# Patient Record
Sex: Female | Born: 1935 | Race: White | Hispanic: No | Marital: Married | Smoking: Never smoker
Health system: Southern US, Community
[De-identification: ages and names within clinical notes are randomized; demographics above are authoritative.]

## PROBLEM LIST (undated history)

## (undated) DIAGNOSIS — R55 Syncope and collapse: Secondary | ICD-10-CM

## (undated) DIAGNOSIS — C801 Malignant (primary) neoplasm, unspecified: Secondary | ICD-10-CM

## (undated) DIAGNOSIS — M1990 Unspecified osteoarthritis, unspecified site: Secondary | ICD-10-CM

## (undated) DIAGNOSIS — Z86718 Personal history of other venous thrombosis and embolism: Secondary | ICD-10-CM

## (undated) DIAGNOSIS — I10 Essential (primary) hypertension: Secondary | ICD-10-CM

## (undated) HISTORY — PX: APPENDECTOMY: SHX54

## (undated) HISTORY — PX: BREAST BIOPSY: SHX20

## (undated) HISTORY — PX: TONSILLECTOMY: SUR1361

## (undated) HISTORY — PX: COLONOSCOPY: SHX174

---

## 2004-05-07 ENCOUNTER — Ambulatory Visit: Payer: Self-pay | Admitting: Gastroenterology

## 2004-05-21 ENCOUNTER — Ambulatory Visit: Payer: Self-pay | Admitting: Internal Medicine

## 2005-04-21 ENCOUNTER — Ambulatory Visit: Payer: Self-pay | Admitting: Internal Medicine

## 2005-05-24 ENCOUNTER — Ambulatory Visit: Payer: Self-pay | Admitting: Internal Medicine

## 2006-05-30 ENCOUNTER — Ambulatory Visit: Payer: Self-pay | Admitting: Internal Medicine

## 2007-02-13 ENCOUNTER — Ambulatory Visit: Payer: Self-pay | Admitting: Internal Medicine

## 2007-03-07 ENCOUNTER — Ambulatory Visit: Payer: Self-pay | Admitting: Internal Medicine

## 2007-05-10 ENCOUNTER — Ambulatory Visit: Payer: Self-pay | Admitting: Gastroenterology

## 2007-06-01 ENCOUNTER — Ambulatory Visit: Payer: Self-pay | Admitting: Internal Medicine

## 2008-06-04 ENCOUNTER — Ambulatory Visit: Payer: Self-pay | Admitting: Internal Medicine

## 2008-09-03 ENCOUNTER — Ambulatory Visit: Payer: Self-pay | Admitting: Internal Medicine

## 2009-06-11 ENCOUNTER — Ambulatory Visit: Payer: Self-pay | Admitting: Internal Medicine

## 2010-06-12 ENCOUNTER — Ambulatory Visit: Payer: Self-pay | Admitting: Internal Medicine

## 2011-06-14 ENCOUNTER — Ambulatory Visit: Payer: Self-pay | Admitting: Internal Medicine

## 2011-09-14 ENCOUNTER — Ambulatory Visit: Payer: Self-pay | Attending: Physical Therapy | Admitting: Physical Therapy

## 2012-06-14 ENCOUNTER — Ambulatory Visit: Payer: Self-pay | Admitting: Internal Medicine

## 2012-10-25 ENCOUNTER — Ambulatory Visit: Payer: Self-pay | Admitting: Gastroenterology

## 2013-06-02 ENCOUNTER — Emergency Department: Payer: Self-pay

## 2013-06-02 LAB — URINALYSIS, COMPLETE
BACTERIA: NONE SEEN
BILIRUBIN, UR: NEGATIVE
Blood: NEGATIVE
Glucose,UR: NEGATIVE mg/dL (ref 0–75)
KETONE: NEGATIVE
Leukocyte Esterase: NEGATIVE
NITRITE: NEGATIVE
Ph: 7 (ref 4.5–8.0)
Protein: NEGATIVE
RBC,UR: NONE SEEN /HPF (ref 0–5)
Specific Gravity: 1.013 (ref 1.003–1.030)
WBC UR: 1 /HPF (ref 0–5)

## 2013-06-02 LAB — BASIC METABOLIC PANEL
Anion Gap: 7 (ref 7–16)
BUN: 12 mg/dL (ref 7–18)
Calcium, Total: 9.5 mg/dL (ref 8.5–10.1)
Chloride: 102 mmol/L (ref 98–107)
Co2: 29 mmol/L (ref 21–32)
Creatinine: 1.01 mg/dL (ref 0.60–1.30)
EGFR (African American): >60
GFR CALC NON AF AMER: 54 — AB
Glucose: 101 mg/dL — ABNORMAL HIGH (ref 65–99)
Osmolality: 276 (ref 275–301)
Potassium: 4.1 mmol/L (ref 3.5–5.1)
SODIUM: 138 mmol/L (ref 136–145)

## 2013-06-02 LAB — CBC
HCT: 40.2 % (ref 35.0–47.0)
HGB: 13.5 g/dL (ref 12.0–16.0)
MCH: 32.4 pg (ref 26.0–34.0)
MCHC: 33.7 g/dL (ref 32.0–36.0)
MCV: 96 fL (ref 80–100)
PLATELETS: 234 10*3/uL (ref 150–440)
RBC: 4.18 10*6/uL (ref 3.80–5.20)
RDW: 13.5 % (ref 11.5–14.5)
WBC: 12.4 10*3/uL — AB (ref 3.6–11.0)

## 2013-06-15 ENCOUNTER — Ambulatory Visit: Payer: Self-pay | Admitting: Internal Medicine

## 2014-04-19 DIAGNOSIS — Z86718 Personal history of other venous thrombosis and embolism: Secondary | ICD-10-CM

## 2014-04-19 HISTORY — DX: Personal history of other venous thrombosis and embolism: Z86.718

## 2014-06-17 ENCOUNTER — Ambulatory Visit: Payer: Self-pay | Admitting: Internal Medicine

## 2014-11-29 ENCOUNTER — Emergency Department: Payer: Medicare PPO

## 2014-11-29 ENCOUNTER — Inpatient Hospital Stay
Admission: EM | Admit: 2014-11-29 | Discharge: 2014-12-02 | DRG: 176 | Disposition: A | Discharge status: Home / Self Care | Payer: Medicare PPO | Attending: Internal Medicine | Admitting: Internal Medicine

## 2014-11-29 ENCOUNTER — Encounter: Payer: Self-pay | Admitting: Emergency Medicine

## 2014-11-29 ENCOUNTER — Inpatient Hospital Stay
Admit: 2014-11-29 | Discharge: 2014-11-29 | Disposition: A | Discharge status: Home / Self Care | Payer: Medicare PPO | Attending: Internal Medicine | Admitting: Internal Medicine

## 2014-11-29 DIAGNOSIS — Z888 Allergy status to other drugs, medicaments and biological substances status: Secondary | ICD-10-CM | POA: Diagnosis not present

## 2014-11-29 DIAGNOSIS — M1990 Unspecified osteoarthritis, unspecified site: Secondary | ICD-10-CM | POA: Diagnosis present

## 2014-11-29 DIAGNOSIS — Z9049 Acquired absence of other specified parts of digestive tract: Secondary | ICD-10-CM | POA: Diagnosis present

## 2014-11-29 DIAGNOSIS — Z803 Family history of malignant neoplasm of breast: Secondary | ICD-10-CM

## 2014-11-29 DIAGNOSIS — I2699 Other pulmonary embolism without acute cor pulmonale: Secondary | ICD-10-CM | POA: Diagnosis not present

## 2014-11-29 DIAGNOSIS — Z886 Allergy status to analgesic agent status: Secondary | ICD-10-CM

## 2014-11-29 DIAGNOSIS — R0902 Hypoxemia: Secondary | ICD-10-CM | POA: Diagnosis present

## 2014-11-29 DIAGNOSIS — M810 Age-related osteoporosis without current pathological fracture: Secondary | ICD-10-CM | POA: Diagnosis present

## 2014-11-29 DIAGNOSIS — I10 Essential (primary) hypertension: Secondary | ICD-10-CM | POA: Diagnosis present

## 2014-11-29 DIAGNOSIS — Z79899 Other long term (current) drug therapy: Secondary | ICD-10-CM | POA: Diagnosis not present

## 2014-11-29 HISTORY — DX: Unspecified osteoarthritis, unspecified site: M19.90

## 2014-11-29 LAB — URINALYSIS COMPLETE WITH MICROSCOPIC (ARMC ONLY)
BILIRUBIN URINE: NEGATIVE
Bacteria, UA: NONE SEEN
Glucose, UA: NEGATIVE mg/dL
HGB URINE DIPSTICK: NEGATIVE
Nitrite: NEGATIVE
Protein, ur: NEGATIVE mg/dL
Specific Gravity, Urine: 1.045 — ABNORMAL HIGH (ref 1.005–1.030)
Trans Epithel, UA: <1
pH: 6 (ref 5.0–8.0)

## 2014-11-29 LAB — COMPREHENSIVE METABOLIC PANEL
ALBUMIN: 3.5 g/dL (ref 3.5–5.0)
ALK PHOS: 49 U/L (ref 38–126)
ALT: 17 U/L (ref 14–54)
ANION GAP: 13 (ref 5–15)
AST: 33 U/L (ref 15–41)
BUN: 13 mg/dL (ref 6–20)
CO2: 24 mmol/L (ref 22–32)
CREATININE: 1.37 mg/dL — AB (ref 0.44–1.00)
Calcium: 10 mg/dL (ref 8.9–10.3)
Chloride: 104 mmol/L (ref 101–111)
GFR calc Af Amer: 42 mL/min — ABNORMAL LOW (ref >60)
GFR calc non Af Amer: 36 mL/min — ABNORMAL LOW (ref >60)
Glucose, Bld: 201 mg/dL — ABNORMAL HIGH (ref 65–99)
Potassium: 4 mmol/L (ref 3.5–5.1)
Sodium: 141 mmol/L (ref 135–145)
Total Bilirubin: 0.7 mg/dL (ref 0.3–1.2)
Total Protein: 7.6 g/dL (ref 6.5–8.1)

## 2014-11-29 LAB — PROTIME-INR
INR: 1.11
Prothrombin Time: 14.5 seconds (ref 11.4–15.0)

## 2014-11-29 LAB — APTT: APTT: 29 s (ref 24–36)

## 2014-11-29 LAB — TROPONIN I: Troponin I: 0.13 ng/mL — ABNORMAL HIGH (ref <0.031)

## 2014-11-29 LAB — CBC
HCT: 47.4 % — ABNORMAL HIGH (ref 35.0–47.0)
HEMOGLOBIN: 15.4 g/dL (ref 12.0–16.0)
MCH: 31 pg (ref 26.0–34.0)
MCHC: 32.5 g/dL (ref 32.0–36.0)
MCV: 95.5 fL (ref 80.0–100.0)
Platelets: 280 10*3/uL (ref 150–440)
RBC: 4.96 MIL/uL (ref 3.80–5.20)
RDW: 13.6 % (ref 11.5–14.5)
WBC: 21.9 10*3/uL — ABNORMAL HIGH (ref 3.6–11.0)

## 2014-11-29 LAB — FIBRIN DERIVATIVES D-DIMER (ARMC ONLY): Fibrin derivatives D-dimer (ARMC): >7500 — ABNORMAL HIGH (ref 0–499)

## 2014-11-29 LAB — HEPARIN LEVEL (UNFRACTIONATED): Heparin Unfractionated: 0.79 IU/mL — ABNORMAL HIGH (ref 0.30–0.70)

## 2014-11-29 LAB — BRAIN NATRIURETIC PEPTIDE: B NATRIURETIC PEPTIDE 5: 710 pg/mL — AB (ref 0.0–100.0)

## 2014-11-29 MED ORDER — CALCIUM CARBONATE-VITAMIN D 500-200 MG-UNIT PO TABS
1.0000 | ORAL_TABLET | Freq: Two times a day (BID) | ORAL | Status: DC
  Administered 2014-11-29 – 2014-12-02 (×6): 1 via ORAL
  Filled 2014-11-29 (×6): qty 1

## 2014-11-29 MED ORDER — HEPARIN (PORCINE) IN NACL 100-0.45 UNIT/ML-% IJ SOLN
1100.0000 [IU]/h | INTRAMUSCULAR | Status: DC
  Administered 2014-11-29: 1200 [IU]/h via INTRAVENOUS
  Administered 2014-11-30: 1100 [IU]/h via INTRAVENOUS
  Filled 2014-11-29 (×5): qty 250

## 2014-11-29 MED ORDER — METOPROLOL TARTRATE 25 MG PO TABS
12.5000 mg | ORAL_TABLET | Freq: Every day | ORAL | Status: DC
  Administered 2014-11-29: 12.5 mg via ORAL
  Filled 2014-11-29: qty 1

## 2014-11-29 MED ORDER — ACETAMINOPHEN 325 MG PO TABS: 650.0000 mg | ORAL_TABLET | Freq: Three times a day (TID) | ORAL | Status: DC | PRN

## 2014-11-29 MED ORDER — SODIUM CHLORIDE 0.9 % IJ SOLN: 3.0000 mL | INTRAMUSCULAR | Status: DC | PRN

## 2014-11-29 MED ORDER — IPRATROPIUM-ALBUTEROL 0.5-2.5 (3) MG/3ML IN SOLN
3.0000 mL | Freq: Once | RESPIRATORY_TRACT | Status: AC
  Administered 2014-11-29: 3 mL via RESPIRATORY_TRACT
  Filled 2014-11-29: qty 3

## 2014-11-29 MED ORDER — SODIUM CHLORIDE 0.9 % IJ SOLN
3.0000 mL | Freq: Two times a day (BID) | INTRAMUSCULAR | Status: DC
  Administered 2014-11-29: 3 mL via INTRAVENOUS

## 2014-11-29 MED ORDER — IOHEXOL 350 MG/ML SOLN
75.0000 mL | Freq: Once | INTRAVENOUS | Status: AC | PRN
  Administered 2014-11-29: 65 mL via INTRAVENOUS

## 2014-11-29 MED ORDER — HEPARIN BOLUS VIA INFUSION
4300.0000 [IU] | Freq: Once | INTRAVENOUS | Status: AC
  Administered 2014-11-29: 4300 [IU] via INTRAVENOUS
  Filled 2014-11-29: qty 4300

## 2014-11-29 MED ORDER — ADULT MULTIVITAMIN W/MINERALS CH
1.0000 | ORAL_TABLET | Freq: Every day | ORAL | Status: DC
  Administered 2014-11-29 – 2014-12-02 (×4): 1 via ORAL
  Filled 2014-11-29 (×7): qty 1

## 2014-11-29 NOTE — Progress Notes (Signed)
*  PRELIMINARY RESULTS* Echocardiogram 2D Echocardiogram has been performed.  Cheryl Rosales 11/29/2014, 7:51 PM

## 2014-11-29 NOTE — Progress Notes (Signed)
Pt. admitted to unit, [id number]. Oriented to room, call bell, Ascom phones and staff. Bed in low position. Fall safety plan reviewed, contract signed and placed on wall, yellow non-skid socks in place, bed alarm on. Full assessment to Epic. Will continue to monitor.

## 2014-11-29 NOTE — ED Notes (Signed)
Pt much improved with NRB.  Pt no longer clammy.

## 2014-11-29 NOTE — ED Notes (Signed)
Reports sob for several days, worse today.  Pale and clamy on arrival, a&o

## 2014-11-29 NOTE — ED Notes (Signed)
NRB removed, pt placed on 4L O2, 100%, tolerating well.

## 2014-11-29 NOTE — ED Provider Notes (Signed)
[facility] Emergency Department Provider Note  ____________________________________________  Time seen: On arrival  I have reviewed the triage vital signs and the nursing notes.   HISTORY  Chief Complaint Shortness of Breath  Patient respiratory distress  HPI Cheryl Rosales is a 80 y.o. female who presents with shortness of breath. Per her husband she has had worsening shortness of breath in the last 3 days which they attribute to taking meloxicam. No recent travel, no leg pain, no history of smoking. She has had an occasional cough but no fevers. She denies chest pain or pleurisy       Past Medical History  Diagnosis Date  . Arthritis     There are no active problems to display for this patient.   Past Surgical History  Procedure Laterality Date  . Appendectomy      Current Outpatient Rx  Name  Route  Sig  Dispense  Refill  . acetaminophen (ARTHRITIS PAIN) 650 MG CR tablet   Oral   Take 650 mg by mouth every 8 (eight) hours as needed for pain.         . Biotin 5 MG TABS   Oral   Take 1 tablet by mouth daily.         . Calcium Carb-Cholecalciferol (CALCIUM 600 + D PO)   Oral   Take 1 tablet by mouth 2 (two) times daily.         . chlorpheniramine (CHLOR-TRIMETON) 4 MG tablet   Oral   Take 4 mg by mouth 2 (two) times daily as needed for allergies.         . cholecalciferol (VITAMIN D) 1000 UNITS tablet   Oral   Take 1,000 Units by mouth daily.         . metoprolol tartrate (LOPRESSOR) 25 MG tablet   Oral   Take 12.5 mg by mouth daily.         . Multiple Vitamins-Minerals (SENIOR MULTIVITAMIN PLUS) TABS   Oral   Take 1 tablet by mouth daily.         . Omega-3 Fatty Acids (FISH OIL) 1200 MG CAPS   Oral   Take 1 capsule by mouth 2 (two) times daily.         . raloxifene (EVISTA) 60 MG tablet   Oral   Take 60 mg by mouth daily.           Allergies Codeine and Demerol  History reviewed. No pertinent  family history.  Social History Social History  Substance Use Topics  . Smoking status: Never Smoker   . Smokeless tobacco: None  . Alcohol Use: No    Review of Systems  Constitutional: Negative for fever. Eyes: Negative for visual changes. ENT: Negative for sore throat Cardiovascular: Negative for chest pain. Respiratory: Positive for shortness of breath, no pleurisy Gastrointestinal: Negative for abdominal pain, vomiting and diarrhea. Genitourinary: Negative for dysuria. Musculoskeletal: Negative for back pain. Skin: Negative for rash. Neurological: Negative for headaches  Psychiatric: Positive for anxiety    ____________________________________________   PHYSICAL EXAM:  VITAL SIGNS: ED Triage Vitals  Enc Vitals Group     BP 11/29/14 1002 78/54 mmHg     Pulse Rate 11/29/14 1002 118     Resp 11/29/14 1002 22     Temp 11/29/14 1002 98 F (36.7 C)     Temp Source 11/29/14 1002 Oral     SpO2 11/29/14 1040 100 %     Weight 11/29/14 1002  160 lb (72.576 kg)     Height 11/29/14 1002 5\' 7"  (1.702 m)     Head Cir --      Peak Flow --      Pain Score 11/29/14 1003 3     Pain Loc --      Pain Edu? --      Excl. in [place]? --      Constitutional: Alert and oriented. In moderate respiratory distress Eyes: Conjunctivae are normal.  ENT   Head: Normocephalic and atraumatic.   Mouth/Throat: Mucous membranes are moist. Cardiovascular: Tachycardia, regular rhythm. Normal and symmetric distal pulses are present in all extremities. No murmurs, rubs, or gallops. Respiratory: Tachypnea. Breath sounds are clear and equal bilaterally.  Gastrointestinal: Soft and non-tender in all quadrants. No distention. There is no CVA tenderness. Genitourinary: deferred Musculoskeletal: Nontender with normal range of motion in all extremities. No lower extremity tenderness nor edema. Neurologic:  Normal speech and language. No gross focal neurologic deficits are appreciated. Skin:  Skin  is warm, dry and intact. No rash noted. Psychiatric:  Patient exhibits appropriate insight and judgment.  ____________________________________________    LABS (pertinent positives/negatives)  Labs Reviewed  COMPREHENSIVE METABOLIC PANEL - Abnormal; Notable for the following:    Glucose, Bld 201 (*)    Creatinine, Ser 1.37 (*)    GFR calc non Af Amer 36 (*)    GFR calc Af Amer 42 (*)    All other components within normal limits  CBC - Abnormal; Notable for the following:    WBC 21.9 (*)    HCT 47.4 (*)    All other components within normal limits  TROPONIN I - Abnormal; Notable for the following:    Troponin I 0.13 (*)    All other components within normal limits  FIBRIN DERIVATIVES D-DIMER ([facility] ONLY) - Abnormal; Notable for the following:    Fibrin derivatives D-dimer ([facility]) >7500 (*)    All other components within normal limits  BRAIN NATRIURETIC PEPTIDE  URINALYSIS COMPLETEWITH MICROSCOPIC ([facility] ONLY)    ____________________________________________   EKG  ED ECG REPORT I, Lavonia Drafts, the attending physician, personally viewed and interpreted this ECG.   Date: 11/29/2014  EKG Time: 10:03 AM  Rate: 117  Rhythm: sinus tachycardia  Axis: Normal  Intervals:Incomplete right bundle branch block  ST&T Change: Nonspecific   ____________________________________________    RADIOLOGY I have personally reviewed any xrays that were ordered on this patient: Chest x-ray unremarkable  ____________________________________________   PROCEDURES  Procedure(s) performed: none  Critical Care performed: yes CRITICAL CARE Performed by: Lavonia Drafts   Total critical care time: 40 min  Critical care time was exclusive of separately billable procedures and treating other patients.  Critical care was necessary to treat or prevent imminent or life-threatening deterioration.  Critical care was time spent personally by me on the following activities: development of  treatment plan with patient and/or surrogate as well as nursing, discussions with consultants, evaluation of patient's response to treatment, examination of patient, obtaining history from patient or surrogate, ordering and performing treatments and interventions, ordering and review of laboratory studies, ordering and review of radiographic studies, pulse oximetry and re-evaluation of patient's condition.   ____________________________________________   INITIAL IMPRESSION / ASSESSMENT AND PLAN / ED COURSE  Pertinent labs & imaging results that were available during my care of the patient were reviewed by me and considered in my medical decision making (see chart for details).  She presents in respiratory distress. Initial pulse ox 78%. She  was placed on nonrebreather by the nursing staff brought back immediately. History suspicious for PE versus pneumonia given rapid onset. We'll send d-dimer, troponin check chest x-ray and reevaluate  ----------------------------------------- 11:41 AM on 11/29/2014 -----------------------------------------  Significantly elevated d-dimer so I have ordered CT angio. CXR clear. Patient's bp has improved with fluids and her heart rate has come down. Elevated troponin related to PE? Pending angio  CT angiography shows submassive PE with right heart strain. Discussed with [facility] intensivist who states patient is not a candidate for lysis. I will start heparin here and admitted to the hospital service  ____________________________________________   FINAL CLINICAL IMPRESSION(S) / ED DIAGNOSES  Final diagnoses:  None     Lavonia Drafts, MD 11/29/14 1204

## 2014-11-29 NOTE — ED Notes (Signed)
Report given to Amanda RN

## 2014-11-29 NOTE — Progress Notes (Addendum)
ANTICOAGULATION CONSULT NOTE - Initial Consult  Pharmacy Consult for heparin Indication: pulmonary embolus  Allergies  Allergen Reactions  . Mobic [Meloxicam] Shortness Of Breath    Per pt report  . Codeine Swelling  . Demerol [Meperidine] Swelling  . Evista [Raloxifene] Other (See Comments)    Pt reports MD told her possibly gave her blood clots    Patient Measurements: Height: 5\' 7"  (170.2 cm) Weight: 160 lb (72.576 kg) IBW/kg (Calculated) : 61.6 Heparin Dosing Weight: 73  Vital Signs: Temp: 97.7 F (36.5 C) (08/12 2035) Temp Source: Oral (08/12 2035) BP: 152/86 mmHg (08/12 2035) Pulse Rate: 106 (08/12 2035)  Labs:  Recent Labs  11/29/14 1004 11/29/14 1217 11/29/14 2040  HGB 15.4  --   --   HCT 47.4*  --   --   PLT 280  --   --   APTT  --  29  --   LABPROT  --  14.5  --   INR  --  1.11  --   HEPARINUNFRC  --   --  0.79*  CREATININE 1.37*  --   --   TROPONINI 0.13*  --   --     Estimated Creatinine Clearance: 32.9 mL/min (by C-G formula based on Cr of 1.37).   Medical History: Past Medical History  Diagnosis Date  . Arthritis    Assessment: Pharmacy consulted to dose heparin for PE in this 80 year old female.   Goal of Therapy:  Heparin level 0.3-0.7 units/ml   Plan:  Give 4300 units bolus x 1 Start heparin infusion at 1200 units/hr Check anti-Xa level in 8 hours and daily while on heparin Continue to monitor H&H and platelets   8/12 :  HL @ 21:00 = 0.79            Will decrease heparin drip rate to 1100 units/hr and recheck HL 6 hrs after rate change.  Rexene Edison, PharmD Clinical Pharmacist  11/29/2014,10:27 PM  8/14 AM anti-Xa 0.47. [place], PharmD, BCPS  12/01/2014

## 2014-11-29 NOTE — H&P (Signed)
[facility] NAME: Cheryl Rosales    MR#:  [id number]  DATE OF BIRTH:  10-05-1934  DATE OF ADMISSION:  11/29/2014  PRIMARY CARE PHYSICIAN: Idelle Crouch, MD   REQUESTING/REFERRING PHYSICIAN: [place]  CHIEF COMPLAINT:   Chief Complaint  Patient presents with  . Shortness of Breath    HISTORY OF PRESENT ILLNESS: Cheryl Rosales  is a 80 y.o. female with a known history of arthritis and osteoporosis,intra articular steroid injection and meloxicam use- also on reloxifene for osteoporosis-  for last 2-3 weeks has been feeling more short of breath, so came to emergency room. CT scan angiogram showed pulmonary embolism. Requiring supplemental oxygen.  Blood pressure stable.  Denies recent long distance travel, surgery, family history of blood clots. Colonoscopy 2 years ago- negative.  PAST MEDICAL HISTORY:   Past Medical History  Diagnosis Date  . Arthritis     PAST SURGICAL HISTORY:  Past Surgical History  Procedure Laterality Date  . Appendectomy      SOCIAL HISTORY:  Social History  Substance Use Topics  . Smoking status: Never Smoker   . Smokeless tobacco: Not on file  . Alcohol Use: No    FAMILY HISTORY:  Family History  Problem Relation Age of Onset  . Breast cancer Mother     DRUG ALLERGIES:  Allergies  Allergen Reactions  . Codeine Swelling  . Demerol [Meperidine] Swelling    REVIEW OF SYSTEMS:   CONSTITUTIONAL: No fever, fatigue or weakness.  EYES: No blurred or double vision.  EARS, NOSE, AND THROAT: No tinnitus or ear pain.  RESPIRATORY: No cough, positive for shortness of breath, no wheezing or hemoptysis.  CARDIOVASCULAR: No chest pain, orthopnea, edema.  GASTROINTESTINAL: No nausea, vomiting, diarrhea or abdominal pain.  GENITOURINARY: No dysuria, hematuria.  ENDOCRINE: No polyuria, nocturia,  HEMATOLOGY: No anemia, easy bruising or bleeding SKIN: No rash or lesion. MUSCULOSKELETAL:  right knee joint pain or arthritis.   NEUROLOGIC: No tingling, numbness, weakness.  PSYCHIATRY: No anxiety or depression.   MEDICATIONS AT HOME:  Prior to Admission medications   Medication Sig Start Date End Date Taking? Authorizing Provider  acetaminophen (ARTHRITIS PAIN) 650 MG CR tablet Take 650 mg by mouth every 8 (eight) hours as needed for pain.   Yes Historical Provider, MD  Biotin 5 MG TABS Take 1 tablet by mouth daily.   Yes Historical Provider, MD  Calcium Carb-Cholecalciferol (CALCIUM 600 + D PO) Take 1 tablet by mouth 2 (two) times daily.   Yes Historical Provider, MD  chlorpheniramine (CHLOR-TRIMETON) 4 MG tablet Take 4 mg by mouth 2 (two) times daily as needed for allergies.   Yes Historical Provider, MD  cholecalciferol (VITAMIN D) 1000 UNITS tablet Take 1,000 Units by mouth daily.   Yes Historical Provider, MD  metoprolol tartrate (LOPRESSOR) 25 MG tablet Take 12.5 mg by mouth daily.   Yes Historical Provider, MD  Multiple Vitamins-Minerals (SENIOR MULTIVITAMIN PLUS) TABS Take 1 tablet by mouth daily.   Yes Historical Provider, MD  Omega-3 Fatty Acids (FISH OIL) 1200 MG CAPS Take 1 capsule by mouth 2 (two) times daily.   Yes Historical Provider, MD  raloxifene (EVISTA) 60 MG tablet Take 60 mg by mouth daily.   Yes Historical Provider, MD      PHYSICAL EXAMINATION:   VITAL SIGNS: Blood pressure 145/91, pulse 101, temperature 97.8 F (36.6 C), temperature source Oral, resp. rate 15, height 5\' 7"  (1.702 m), weight 72.576 kg (160 lb),  SpO2 100 %.  GENERAL:  80 y.o.-year-old patient lying in the bed with no acute distress.  EYES: Pupils equal, round, reactive to light and accommodation. No scleral icterus. Extraocular muscles intact.  HEENT: Head atraumatic, normocephalic. Oropharynx and nasopharynx clear.  NECK:  Supple, no jugular venous distention. No thyroid enlargement, no tenderness.  LUNGS: Normal breath sounds bilaterally, no wheezing, rales,rhonchi or crepitation. No  use of accessory muscles of respiration. Use of Suplemental oxygen. CARDIOVASCULAR: S1, S2 normal. No murmurs, rubs, or gallops.  ABDOMEN: Soft, nontender, nondistended. Bowel sounds present. No organomegaly or mass.  EXTREMITIES: No pedal edema, cyanosis, or clubbing.  NEUROLOGIC: Cranial nerves II through XII are intact. Muscle strength 5/5 in all extremities. Sensation intact. Gait not checked.  PSYCHIATRIC: The patient is alert and oriented x 3.  SKIN: No obvious rash, lesion, or ulcer.   LABORATORY PANEL:   CBC  Recent Labs Lab 11/29/14 1004  WBC 21.9*  HGB 15.4  HCT 47.4*  PLT 280  MCV 95.5  MCH 31.0  MCHC 32.5  RDW 13.6   ------------------------------------------------------------------------------------------------------------------  Chemistries   Recent Labs Lab 11/29/14 1004  NA 141  K 4.0  CL 104  CO2 24  GLUCOSE 201*  BUN 13  CREATININE 1.37*  CALCIUM 10.0  AST 33  ALT 17  ALKPHOS 49  BILITOT 0.7   ------------------------------------------------------------------------------------------------------------------ estimated creatinine clearance is 32.9 mL/min (by C-G formula based on Cr of 1.37). ------------------------------------------------------------------------------------------------------------------ No results for input(s): TSH, T4TOTAL, T3FREE, THYROIDAB in the last 72 hours.  Invalid input(s): FREET3   Coagulation profile No results for input(s): INR, PROTIME in the last 168 hours. ------------------------------------------------------------------------------------------------------------------- No results for input(s): DDIMER in the last 72 hours. -------------------------------------------------------------------------------------------------------------------  Cardiac Enzymes  Recent Labs Lab 11/29/14 1004  TROPONINI 0.13*    ------------------------------------------------------------------------------------------------------------------ Invalid input(s): POCBNP  ---------------------------------------------------------------------------------------------------------------  Urinalysis    Component Value Date/Time   COLORURINE Yellow 06/02/2013 1535   APPEARANCEUR Hazy 06/02/2013 1535   LABSPEC 1.013 06/02/2013 1535   PHURINE 7.0 06/02/2013 1535   GLUCOSEU Negative 06/02/2013 1535   HGBUR Negative 06/02/2013 1535   BILIRUBINUR Negative 06/02/2013 1535   KETONESUR Negative 06/02/2013 1535   PROTEINUR Negative 06/02/2013 1535   NITRITE Negative 06/02/2013 1535   LEUKOCYTESUR Negative 06/02/2013 1535     RADIOLOGY: Ct Angio Chest Pe W/cm &/or Wo Cm  11/29/2014   CLINICAL DATA:  Worsening shortness of breath today and elevated D-dimer.  EXAM: CT ANGIOGRAPHY CHEST WITH CONTRAST  TECHNIQUE: Multidetector CT imaging of the chest was performed using the standard protocol during bolus administration of intravenous contrast. Multiplanar CT image reconstructions and MIPs were obtained to evaluate the vascular anatomy.  CONTRAST:  39mL OMNIPAQUE IOHEXOL 350 MG/ML SOLN  COMPARISON:  None.  FINDINGS: Chest wall: No breast masses, supraclavicular or axillary lymphadenopathy. The bony thorax is intact. The thyroid gland appears normal.  Mediastinum: The heart is normal in size. No pericardial effusion. No mediastinal or hilar mass or lymphadenopathy. The esophagus is grossly normal.  The aorta is normal in caliber. No dissection. Scattered atherosclerotic calcifications.  The pulmonary arterial tree is well opacified. There is extensive sub massive bilateral pulmonary emboli. There is evidence of right heart strain with marked flattening of the left ventricle. The RV/ LV ratio was 1.4.  Lungs/pleura: There are small bilateral pleural effusions and overlying atelectasis. There is also patchy areas of right middle lobe and  lingular atelectasis. Mild emphysematous changes are noted. No infiltrates or pulmonary lesions.  Upper abdomen:  No significant  findings.  Review of the MIP images confirms the above findings.  IMPRESSION: 1. Positive for acute PE with CT evidence of right heart strain (RV/LV Ratio = ) consistent with at least submassive (intermediate risk) PE. The presence of right heart strain has been associated with an increased risk of morbidity and mortality. Please activate Code PE by paging [phone]. 2. Normal thoracic aorta except for scattered atherosclerotic calcifications. 3. Small pleural effusions and overlying atelectasis. These results were called by telephone at the time of interpretation on 11/29/2014 at 11:55 am to Dr. Lavonia Drafts , who verbally acknowledged these results.   Electronically Signed   By: Marijo Sanes M.D.   On: 11/29/2014 11:55   Dg Chest Port 1 View  11/29/2014   CLINICAL DATA:  Evaluation of respiratory distress. Patient has increasing difficulty breathing. Patient states that she has a HX of HTN and syncope  EXAM: PORTABLE CHEST - 1 VIEW  COMPARISON:  06/02/2013  FINDINGS: Cardiac silhouette normal in size and configuration. No mediastinal or hilar masses. No evidence of adenopathy.  Lungs are hyperexpanded. Calcified granuloma noted in the left mid lung. No lung consolidation or edema. No pleural effusion or pneumothorax.  Bony thorax is demineralized but grossly intact.  IMPRESSION: 1. No acute cardiopulmonary disease. 2. Lung hyperexpansion suggests COPD. No change from the prior chest radiograph.   Electronically Signed   By: Lajean Manes M.D.   On: 11/29/2014 10:33    IMPRESSION AND PLAN:  * Pulmonary embolism   IV heparin, may swich to Newer anti Coags tomorrow.   Likely reason- Reloxifene- stopped.   Follow with Hematologist as out pt.  * Ac respi failure- hypoxia   Recheck after treatment for PE in 1-2 days.   Nasal canula oxygen supplementation meanwhile.  *  Arthritis   Symptomatic pain management.  All the records are reviewed and case discussed with ED provider. Management plans discussed with the patient, family and they are in agreement.  CODE STATUS: Full   TOTAL TIME TAKING CARE OF THIS PATIENT: 50 minutes.    Vaughan Basta M.D on 11/29/2014   Between 7am to 6pm - Pager - [phone]  After 6pm go to [email or web address] - password [email or web address] [facility]  Office  [phone]  CC: Primary care physician; Idelle Crouch, MD

## 2014-11-29 NOTE — Progress Notes (Signed)
ANTICOAGULATION CONSULT NOTE - Initial Consult  Pharmacy Consult for heparin Indication: pulmonary embolus  Allergies  Allergen Reactions  . Codeine Swelling  . Demerol [Meperidine] Swelling    Patient Measurements: Height: 5\' 7"  (170.2 cm) Weight: 160 lb (72.576 kg) IBW/kg (Calculated) : 61.6 Heparin Dosing Weight: 73  Vital Signs: Temp: 98 F (36.7 C) (08/12 1137) Temp Source: Axillary (08/12 1137) BP: 144/97 mmHg (08/12 1137) Pulse Rate: 102 (08/12 1137)  Labs:  Recent Labs  11/29/14 1004  HGB 15.4  HCT 47.4*  PLT 280  CREATININE 1.37*  TROPONINI 0.13*    Estimated Creatinine Clearance: 32.9 mL/min (by C-G formula based on Cr of 1.37).   Medical History: Past Medical History  Diagnosis Date  . Arthritis    Assessment: Pharmacy consulted to dose heparin for PE in this 80 year old female.   Goal of Therapy:  Heparin level 0.3-0.7 units/ml   Plan:  Give 4300 units bolus x 1 Start heparin infusion at 1200 units/hr Check anti-Xa level in 8 hours and daily while on heparin Continue to monitor H&H and platelets  Rexene Edison, PharmD Clinical Pharmacist  11/29/2014,12:12 PM

## 2014-11-30 LAB — BASIC METABOLIC PANEL
ANION GAP: 8 (ref 5–15)
BUN: 11 mg/dL (ref 6–20)
CALCIUM: 8.9 mg/dL (ref 8.9–10.3)
CHLORIDE: 107 mmol/L (ref 101–111)
CO2: 26 mmol/L (ref 22–32)
Creatinine, Ser: 0.93 mg/dL (ref 0.44–1.00)
GFR calc Af Amer: >60 mL/min (ref >60)
GFR calc non Af Amer: 57 mL/min — ABNORMAL LOW (ref >60)
Glucose, Bld: 111 mg/dL — ABNORMAL HIGH (ref 65–99)
POTASSIUM: 4.1 mmol/L (ref 3.5–5.1)
Sodium: 141 mmol/L (ref 135–145)

## 2014-11-30 LAB — CBC
HCT: 39.3 % (ref 35.0–47.0)
Hemoglobin: 12.9 g/dL (ref 12.0–16.0)
MCH: 30.5 pg (ref 26.0–34.0)
MCHC: 32.8 g/dL (ref 32.0–36.0)
MCV: 93 fL (ref 80.0–100.0)
PLATELETS: 247 10*3/uL (ref 150–440)
RBC: 4.22 MIL/uL (ref 3.80–5.20)
RDW: 13.7 % (ref 11.5–14.5)
WBC: 12.8 10*3/uL — ABNORMAL HIGH (ref 3.6–11.0)

## 2014-11-30 LAB — HEPARIN LEVEL (UNFRACTIONATED)
Heparin Unfractionated: 0.6 IU/mL (ref 0.30–0.70)
Heparin Unfractionated: 0.6 IU/mL (ref 0.30–0.70)

## 2014-11-30 MED ORDER — BENZONATATE 100 MG PO CAPS
100.0000 mg | ORAL_CAPSULE | Freq: Three times a day (TID) | ORAL | Status: DC | PRN
  Administered 2014-11-30 – 2014-12-02 (×4): 100 mg via ORAL
  Filled 2014-11-30 (×4): qty 1

## 2014-11-30 MED ORDER — METOPROLOL TARTRATE 25 MG PO TABS
12.5000 mg | ORAL_TABLET | Freq: Two times a day (BID) | ORAL | Status: DC
  Administered 2014-11-30 – 2014-12-02 (×4): 12.5 mg via ORAL
  Filled 2014-11-30 (×5): qty 1

## 2014-11-30 MED ORDER — CEFUROXIME AXETIL 250 MG PO TABS
250.0000 mg | ORAL_TABLET | Freq: Two times a day (BID) | ORAL | Status: DC
  Administered 2014-11-30 – 2014-12-02 (×5): 250 mg via ORAL
  Filled 2014-11-30 (×7): qty 1

## 2014-11-30 NOTE — Progress Notes (Signed)
PT Cancellation Note  Patient Details Name: Cheryl Rosales MRN: [id number] DOB: 1934/09/14   Cancelled Treatment:    Reason Eval/Treat Not Completed: Patient not medically ready. Chart reviewed, RN consulted. Holding pt treatment at this time due to findings of acute PE. Per policy, will hold all PT until 48hrs p commencement of therapeutic anticoagulation therapy. Will attempt evaluation at later date/time.    Rebbeca Paul C 11/30/2014, 1:55 PM  1:56 PM  Etta Grandchild, PT, DPT [place] License # [id number]

## 2014-11-30 NOTE — Progress Notes (Signed)
ANTICOAGULATION CONSULT NOTE - FOLLOW UP  Consult  Pharmacy Consult for heparin Indication: pulmonary embolus  Allergies  Allergen Reactions  . Mobic [Meloxicam] Shortness Of Breath    Per pt report  . Codeine Swelling  . Demerol [Meperidine] Swelling  . Evista [Raloxifene] Other (See Comments)    Pt reports MD told her possibly gave her blood clots    Patient Measurements: Height: 5\' 7"  (983.3 cm) Weight: 160 lb (72.576 kg) IBW/kg (Calculated) : 61.6 Heparin Dosing Weight: 73  Vital Signs: Temp: 98.3 F (36.8 C) (08/13 0445) Temp Source: Oral (08/13 0445) BP: 143/77 mmHg (08/13 0445) Pulse Rate: 83 (08/13 0445)  Labs:  Recent Labs  11/29/14 1004 11/29/14 1217 11/29/14 2040 11/30/14 0616  HGB 15.4  --   --  12.9  HCT 47.4*  --   --  39.3  PLT 280  --   --  247  APTT  --  29  --   --   LABPROT  --  14.5  --   --   INR  --  1.11  --   --   HEPARINUNFRC  --   --  0.79* 0.60  CREATININE 1.37*  --   --  0.93  TROPONINI 0.13*  --   --   --     Estimated Creatinine Clearance: 48.5 mL/min (by C-G formula based on Cr of 0.93).   Medical History: Past Medical History  Diagnosis Date  . Arthritis    Assessment: Pharmacy consulted to dose heparin for PE in this 80 year old female.   Goal of Therapy:  Heparin level 0.3-0.7 units/ml   Plan:  Give 4300 units bolus x 1 Start heparin infusion at 1200 units/hr Check anti-Xa level in 8 hours and daily while on heparin Continue to monitor H&H and platelets   8/12 :  HL @ 21:00 = 0.79            Will decrease heparin drip rate to 1100 units/hr and recheck HL 6 hrs after rate change.  8/13 HL @ 06:00= 0.60. Will continue current rate. Heparin level ordered for 12:00.   Larene Beach, PharmD Clinical Pharmacist  11/30/2014,7:52 AM

## 2014-11-30 NOTE — Progress Notes (Signed)
Cheryl Rosales is a 80 y.o. female  Pulmonary embolism   SUBJECTIVE:  Pt admitted with PE and hypoxia. Still with DOE. Denies CP. On Heaprin drip. VSS, labs stable.  ______________________________________________________________________  ROS: Review of systems is unremarkable for any active cardiac,respiratory, GI, GU, hematologic, neurologic or psychiatric systems, 10 systems reviewed.  @CMEDLIST @  Past Medical History  Diagnosis Date  . Arthritis     Past Surgical History  Procedure Laterality Date  . Appendectomy      PHYSICAL EXAM:  BP 143/77 mmHg  Pulse 83  Temp(Src) 98.3 F (36.8 C) (Oral)  Resp 18  Ht 5\' 7"  (1.702 m)  Wt 72.576 kg (160 lb)  BMI 25.05 kg/m2  SpO2 94%  Wt Readings from Last 3 Encounters:  11/29/14 72.576 kg (160 lb)            Constitutional: NAD Neck: supple, no thyromegaly Respiratory: scattered rhonchi Cardiovascular: RRR, no murmur, no gallop Abdomen: soft, good BS, nontender Extremities: no edema Neuro: alert and oriented, no focal motor or sensory deficits  ASSESSMENT/PLAN:  Labs and imaging studies were reviewed  No change in care. CXR and labs tomorrow. PT eval today. Continue Heparin drip. Wean O2.

## 2014-12-01 LAB — COMPREHENSIVE METABOLIC PANEL
ALT: 12 U/L — AB (ref 14–54)
AST: 17 U/L (ref 15–41)
Albumin: 2.7 g/dL — ABNORMAL LOW (ref 3.5–5.0)
Alkaline Phosphatase: 37 U/L — ABNORMAL LOW (ref 38–126)
Anion gap: 8 (ref 5–15)
BILIRUBIN TOTAL: 0.4 mg/dL (ref 0.3–1.2)
BUN: 15 mg/dL (ref 6–20)
CHLORIDE: 108 mmol/L (ref 101–111)
CO2: 24 mmol/L (ref 22–32)
Calcium: 8.8 mg/dL — ABNORMAL LOW (ref 8.9–10.3)
Creatinine, Ser: 0.89 mg/dL (ref 0.44–1.00)
GFR calc Af Amer: >60 mL/min (ref >60)
GFR calc non Af Amer: >60 mL/min (ref >60)
GLUCOSE: 107 mg/dL — AB (ref 65–99)
POTASSIUM: 3.9 mmol/L (ref 3.5–5.1)
SODIUM: 140 mmol/L (ref 135–145)
Total Protein: 5.5 g/dL — ABNORMAL LOW (ref 6.5–8.1)

## 2014-12-01 LAB — CBC WITH DIFFERENTIAL/PLATELET
Basophils Absolute: 0.1 10*3/uL (ref 0–0.1)
Basophils Relative: 1 %
EOS ABS: 0.5 10*3/uL (ref 0–0.7)
Eosinophils Relative: 4 %
HEMATOCRIT: 38.4 % (ref 35.0–47.0)
Hemoglobin: 12.5 g/dL (ref 12.0–16.0)
Lymphocytes Relative: 24 %
Lymphs Abs: 2.8 10*3/uL (ref 1.0–3.6)
MCH: 30.8 pg (ref 26.0–34.0)
MCHC: 32.6 g/dL (ref 32.0–36.0)
MCV: 94.3 fL (ref 80.0–100.0)
Monocytes Absolute: 0.8 10*3/uL (ref 0.2–0.9)
Monocytes Relative: 7 %
NEUTROS ABS: 7.7 10*3/uL — AB (ref 1.4–6.5)
Neutrophils Relative %: 64 %
PLATELETS: 251 10*3/uL (ref 150–440)
RBC: 4.07 MIL/uL (ref 3.80–5.20)
RDW: 13.5 % (ref 11.5–14.5)
WBC: 11.8 10*3/uL — AB (ref 3.6–11.0)

## 2014-12-01 LAB — HEPARIN LEVEL (UNFRACTIONATED): Heparin Unfractionated: 0.47 IU/mL (ref 0.30–0.70)

## 2014-12-01 MED ORDER — FAMOTIDINE 20 MG PO TABS
20.0000 mg | ORAL_TABLET | Freq: Two times a day (BID) | ORAL | Status: DC
  Administered 2014-12-01 – 2014-12-02 (×3): 20 mg via ORAL
  Filled 2014-12-01 (×3): qty 1

## 2014-12-01 MED ORDER — RIVAROXABAN 15 MG PO TABS
15.0000 mg | ORAL_TABLET | Freq: Two times a day (BID) | ORAL | Status: DC
  Administered 2014-12-01 – 2014-12-02 (×3): 15 mg via ORAL
  Filled 2014-12-01 (×3): qty 1

## 2014-12-01 NOTE — Progress Notes (Signed)
Heparin gtt stopped per md orders

## 2014-12-01 NOTE — Evaluation (Signed)
Physical Therapy Evaluation Patient Details Name: Cheryl Rosales MRN: [id number] DOB: 1934/11/02 Today's Date: 12/01/2014   History of Present Illness  Pt had been having progressively harder time breathing, found to have had   Clinical Impression  Pt does very well with PT showing ability to ambulate ~150 ft w/o AD and negotiate steps.  She is, however, much slower, more guarded and more quickly fatigued than her baseline.      Follow Up Recommendations Home health PT    Equipment Recommendations  None recommended by PT    Recommendations for Other Services       Precautions / Restrictions Precautions Precautions: Fall Restrictions Weight Bearing Restrictions: No      Mobility  Bed Mobility Overal bed mobility: Independent                Transfers Overall transfer level: Independent               General transfer comment: Pt rises sit to stand with good confidence and no LOBs  Ambulation/Gait Ambulation/Gait assistance: Supervision Ambulation Distance (Feet): 150 Feet Assistive device: None       General Gait Details: Pt is much slower and more guarded than her baseline ambulation.  She has no LOBs and her O2 remains in the 90s on room air but she does become fatigued with the effort.   Stairs Stairs: Yes Stairs assistance: Supervision   Number of Stairs: 7 General stair comments: Pt is able to negotiate steps w/o issues, though she is slower and less confident than at baseline.   Wheelchair Mobility    Modified Rankin (Stroke Patients Only)       Balance                                             Pertinent Vitals/Pain Pain Assessment:  (minimal rib pain from coughing)    Home Living Family/patient expects to be discharged to:: Private residence Living Arrangements: Spouse/significant other Available Help at Discharge: Family Type of Home: House Home Access: Stairs to enter Entrance Stairs-Rails:  Right Entrance Stairs-Number of Steps: 7          Prior Function Level of Independence: Independent         Comments: Pt was active and able to do all that she needs     Hand Dominance        Extremity/Trunk Assessment   Upper Extremity Assessment: Overall WFL for tasks assessed           Lower Extremity Assessment: Overall WFL for tasks assessed         Communication   Communication: No difficulties  Cognition Arousal/Alertness: Awake/alert Behavior During Therapy: WFL for tasks assessed/performed Overall Cognitive Status: Within Functional Limits for tasks assessed                      General Comments      Exercises        Assessment/Plan    PT Assessment Patient needs continued PT services  PT Diagnosis Generalized weakness   PT Problem List Decreased activity tolerance;Decreased strength  PT Treatment Interventions Gait training;Therapeutic activities;Therapeutic exercise;Balance training;Neuromuscular re-education;Stair training;Functional mobility training   PT Goals (Current goals can be found in the Care Plan section) Acute Rehab PT Goals Patient Stated Goal: get strength and endurance back PT Goal Formulation: With patient/family Time  For Goal Achievement: 12/15/14 Potential to Achieve Goals: Good    Frequency Min 2X/week   Barriers to discharge        Co-evaluation               End of Session Equipment Utilized During Treatment: Gait belt Activity Tolerance: Patient limited by fatigue Patient left: with bed alarm set           Time: 3582-5189 PT Time Calculation (min) (ACUTE ONLY): 27 min   Charges:   PT Evaluation $Initial PT Evaluation Tier I: 1 Procedure     PT G Codes:       Wayne Both, PT, DPT [phone]  Kreg Shropshire 12/01/2014, 4:52 PM

## 2014-12-01 NOTE — Progress Notes (Signed)
Cheryl Rosales is a 80 y.o. female  Pulmonary embolism   SUBJECTIVE:  Pt in NAD. Some pleuritic right-sided CP. 98% on 2L. Labs stable. Echo OK  ______________________________________________________________________  ROS: Review of systems is unremarkable for any active cardiac,respiratory, GI, GU, hematologic, neurologic or psychiatric systems, 10 systems reviewed.  @CMEDLIST @  Past Medical History  Diagnosis Date  . Arthritis     Past Surgical History  Procedure Laterality Date  . Appendectomy      PHYSICAL EXAM:  BP 158/82 mmHg  Pulse 77  Temp(Src) 98 F (36.7 C) (Oral)  Resp 18  Ht 5\' 7"  (1.702 m)  Wt 72.576 kg (160 lb)  BMI 25.05 kg/m2  SpO2 98%  Wt Readings from Last 3 Encounters:  11/29/14 72.576 kg (160 lb)            Constitutional: NAD Neck: supple, no thyromegaly Respiratory: occasional rhonchi Cardiovascular: RRR, no murmur, no gallop Abdomen: soft, good BS, nontender Extremities: no edema Neuro: alert and oriented, no focal motor or sensory deficits  ASSESSMENT/PLAN:  Labs and imaging studies were reviewed  D/C heparin. Begin Xarelto. Wean off O2. PT to evaluate today. Possible D/C home in AM.

## 2014-12-02 LAB — CBC
HCT: 38.7 % (ref 35.0–47.0)
Hemoglobin: 12.7 g/dL (ref 12.0–16.0)
MCH: 30.9 pg (ref 26.0–34.0)
MCHC: 32.8 g/dL (ref 32.0–36.0)
MCV: 94.2 fL (ref 80.0–100.0)
Platelets: 257 10*3/uL (ref 150–440)
RBC: 4.11 MIL/uL (ref 3.80–5.20)
RDW: 13.4 % (ref 11.5–14.5)
WBC: 12.1 10*3/uL — ABNORMAL HIGH (ref 3.6–11.0)

## 2014-12-02 MED ORDER — FAMOTIDINE 20 MG PO TABS: 20.0000 mg | ORAL_TABLET | Freq: Two times a day (BID) | ORAL | Status: DC

## 2014-12-02 MED ORDER — BENZONATATE 100 MG PO CAPS: 100.0000 mg | ORAL_CAPSULE | Freq: Three times a day (TID) | ORAL | Status: DC | PRN

## 2014-12-02 MED ORDER — METOPROLOL TARTRATE 25 MG PO TABS: 12.5000 mg | ORAL_TABLET | Freq: Two times a day (BID) | ORAL | Status: DC

## 2014-12-02 MED ORDER — CEFUROXIME AXETIL 250 MG PO TABS: 250.0000 mg | ORAL_TABLET | Freq: Two times a day (BID) | ORAL | Status: DC

## 2014-12-02 MED ORDER — RIVAROXABAN 15 MG PO TABS: 15.0000 mg | ORAL_TABLET | Freq: Two times a day (BID) | ORAL | Status: DC

## 2014-12-02 NOTE — Care Management Note (Addendum)
Case Management Note  Patient Details  Name: ZELINA JIMERSON MRN: [id number] Date of Birth: [birth year]  Subjective/Objective:                 Patient from home admitted with Pulmonary embolism.  Patient lives at home with her husband.  Patient states that she uses [facility] to obtain her medications. Patient states that she has a cane available at home, but does not use it to ambulate.  Patient has never had any home health services, and drives her self to follow up appointments.  PT recommends home health.  MD wrote orders for home RN and PT   Action/Plan: Patient provided list of home health agencies for choice.  Services initiated with [facility].  Corene Cornea from [facility] notified. RN CM signing off  Expected Discharge Date:                  Expected Discharge Plan:  [place]  In-House Referral:     Discharge planning Services  CM Consult  Post Acute Care Choice:  Home Health Choice offered to:  Patient  DME Arranged:    DME Agency:     HH Arranged:  RN, PT [place] Agency:  [facility]  Status of Service:  Completed, signed off  Medicare Important Message Given:    Date Medicare IM Given:    Medicare IM give by:    Date Additional Medicare IM Given:    Additional Medicare Important Message give by:     If discussed at [place] of Stay Meetings, dates discussed:    Additional Comments:  Beverly Sessions, RN 12/02/2014, 8:39 AM

## 2014-12-02 NOTE — Discharge Summary (Signed)
Cheryl Rosales, is a 80 y.o. female  DOB 1934/06/05  MRN [id number].  Admission date:  11/29/2014  Admitting Physician  Idelle Crouch, MD  Discharge Date:  12/02/2014   Primary MD  Idelle Crouch, MD  Recommendations for primary care physician for things to follow:   none   Admission Diagnosis  Pulmonary embolism [I26.99]   Discharge Diagnosis  Pulmonary embolism [I26.99]  HTN, DOE  Principal Problem:   Pulmonary embolism Active Problems:   Pulmonary emboli      Past Medical History  Diagnosis Date  . Arthritis     Past Surgical History  Procedure Laterality Date  . Appendectomy         History of present illness and  Hospital Course:     Kindly see H&P for history of present illness and admission details, please review complete Labs, Consult reports and Test reports for all details in brief  HPI  from the history and physical done on the day of admission    Hospital Course    Pt admitted with PE, IV Heparin x 48 hours, then switched to Xarelto. Stable on RA. Some cough and pleurisy. No bleeding.   Discharge Condition: stable   Follow UP  Follow-up Information    Follow up with Keante Urizar D, MD In 2 days.   Specialty:  Internal Medicine   Contact information:   [place] [address] [phone]         Discharge Instructions  and  Discharge Medications   See below     Medication List    STOP taking these medications        raloxifene 60 MG tablet  Commonly known as:  EVISTA      TAKE these medications        ARTHRITIS PAIN 650 MG CR tablet  Generic drug:  acetaminophen  Take 650 mg by mouth every 8 (eight) hours as needed for pain.     benzonatate 100 MG capsule  Commonly known as:  TESSALON  Take 1 capsule (100 mg total) by mouth 3 (three) times daily as needed for cough.     Biotin 5 MG Tabs  Take 1 tablet by  mouth daily.     CALCIUM 600 + D PO  Take 1 tablet by mouth 2 (two) times daily.     cefUROXime 250 MG tablet  Commonly known as:  CEFTIN  Take 1 tablet (250 mg total) by mouth 2 (two) times daily with a meal.     CHLOR-TRIMETON 4 MG tablet  Generic drug:  chlorpheniramine  Take 4 mg by mouth 2 (two) times daily as needed for allergies.     cholecalciferol 1000 UNITS tablet  Commonly known as:  VITAMIN D  Take 1,000 Units by mouth daily.     famotidine 20 MG tablet  Commonly known as:  PEPCID  Take 1 tablet (20 mg total) by mouth 2 (two) times daily.     Fish Oil 1200 MG Caps  Take  1 capsule by mouth 2 (two) times daily.     metoprolol tartrate 25 MG tablet  Commonly known as:  LOPRESSOR  Take 0.5 tablets (12.5 mg total) by mouth 2 (two) times daily.     Rivaroxaban 15 MG Tabs tablet  Commonly known as:  XARELTO  Take 1 tablet (15 mg total) by mouth 2 (two) times daily with a meal.     SENIOR MULTIVITAMIN PLUS Tabs  Take 1 tablet by mouth daily.          Diet and Activity recommendation: See Discharge Instructions above   Consults obtained - CM, PT   Major procedures and Radiology Reports - PLEASE review detailed and final reports for all details, in brief -   See below   Ct Angio Chest Pe W/cm &/or Wo Cm  11/29/2014   CLINICAL DATA:  Worsening shortness of breath today and elevated D-dimer.  EXAM: CT ANGIOGRAPHY CHEST WITH CONTRAST  TECHNIQUE: Multidetector CT imaging of the chest was performed using the standard protocol during bolus administration of intravenous contrast. Multiplanar CT image reconstructions and MIPs were obtained to evaluate the vascular anatomy.  CONTRAST:  78mL OMNIPAQUE IOHEXOL 350 MG/ML SOLN  COMPARISON:  None.  FINDINGS: Chest wall: No breast masses, supraclavicular or axillary lymphadenopathy. The bony thorax is intact. The thyroid gland appears normal.  Mediastinum: The heart is normal in size. No pericardial effusion. No mediastinal or  hilar mass or lymphadenopathy. The esophagus is grossly normal.  The aorta is normal in caliber. No dissection. Scattered atherosclerotic calcifications.  The pulmonary arterial tree is well opacified. There is extensive sub massive bilateral pulmonary emboli. There is evidence of right heart strain with marked flattening of the left ventricle. The RV/ LV ratio was 1.4.  Lungs/pleura: There are small bilateral pleural effusions and overlying atelectasis. There is also patchy areas of right middle lobe and lingular atelectasis. Mild emphysematous changes are noted. No infiltrates or pulmonary lesions.  Upper abdomen:  No significant findings.  Review of the MIP images confirms the above findings.  IMPRESSION: 1. Positive for acute PE with CT evidence of right heart strain (RV/LV Ratio = ) consistent with at least submassive (intermediate risk) PE. The presence of right heart strain has been associated with an increased risk of morbidity and mortality. Please activate Code PE by paging [phone]. 2. Normal thoracic aorta except for scattered atherosclerotic calcifications. 3. Small pleural effusions and overlying atelectasis. These results were called by telephone at the time of interpretation on 11/29/2014 at 11:55 am to Dr. Lavonia Drafts , who verbally acknowledged these results.   Electronically Signed   By: Marijo Sanes M.D.   On: 11/29/2014 11:55   Dg Chest Port 1 View  11/29/2014   CLINICAL DATA:  Evaluation of respiratory distress. Patient has increasing difficulty breathing. Patient states that she has a HX of HTN and syncope  EXAM: PORTABLE CHEST - 1 VIEW  COMPARISON:  06/02/2013  FINDINGS: Cardiac silhouette normal in size and configuration. No mediastinal or hilar masses. No evidence of adenopathy.  Lungs are hyperexpanded. Calcified granuloma noted in the left mid lung. No lung consolidation or edema. No pleural effusion or pneumothorax.  Bony thorax is demineralized but grossly intact.  IMPRESSION:  1. No acute cardiopulmonary disease. 2. Lung hyperexpansion suggests COPD. No change from the prior chest radiograph.   Electronically Signed   By: Lajean Manes M.D.   On: 11/29/2014 10:33    Micro Results   See below  No results  found for this or any previous visit (from the past 240 hour(s)).     Today   Subjective:   Viktoriya Glaspy today has no headache,no chest abdominal pain,no new weakness tingling or numbness, feels much better wants to go home today.   Objective:   Blood pressure 152/84, pulse 80, temperature 98.2 F (36.8 C), temperature source Oral, resp. rate 19, height 5\' 7"  (1.702 m), weight 72.576 kg (160 lb), SpO2 92 %.   Intake/Output Summary (Last 24 hours) at 12/02/14 0701 Last data filed at 12/02/14 0230  Gross per 24 hour  Intake 676.98 ml  Output   3100 ml  Net -2423.02 ml    Exam Awake Alert, Oriented x 3, No new F.N deficits, Normal affect [place].AT,PERRAL Supple Neck,No JVD, No cervical lymphadenopathy appriciated.  Symmetrical Chest wall movement, Good air movement bilaterally, occasional rhonchi RRR,No Gallops,Rubs or new Murmurs, No Parasternal Heave +ve B.Sounds, Abd Soft, Non tender, No organomegaly appriciated, No rebound -guarding or rigidity. No Cyanosis, Clubbing or edema, No new Rash or bruise  Data Review   CBC w Diff: Lab Results  Component Value Date   WBC 12.1* 12/02/2014   WBC 12.4* 06/02/2013   HGB 12.7 12/02/2014   HGB 13.5 06/02/2013   HCT 38.7 12/02/2014   HCT 40.2 06/02/2013   PLT 257 12/02/2014   PLT 234 06/02/2013   LYMPHOPCT 24 12/01/2014   MONOPCT 7 12/01/2014   EOSPCT 4 12/01/2014   BASOPCT 1 12/01/2014    CMP: Lab Results  Component Value Date   NA 140 12/01/2014   NA 138 06/02/2013   K 3.9 12/01/2014   K 4.1 06/02/2013   CL 108 12/01/2014   CL 102 06/02/2013   CO2 24 12/01/2014   CO2 29 06/02/2013   BUN 15 12/01/2014   BUN 12 06/02/2013   CREATININE 0.89 12/01/2014   CREATININE 1.01 06/02/2013    PROT 5.5* 12/01/2014   ALBUMIN 2.7* 12/01/2014   BILITOT 0.4 12/01/2014   ALKPHOS 37* 12/01/2014   AST 17 12/01/2014   ALT 12* 12/01/2014  .   Total Time in preparing paper work, data evaluation and todays exam - 59 minutes  Iyad Deroo D M.D on 12/02/2014 at 7:01 AM

## 2014-12-02 NOTE — Progress Notes (Signed)
Pt blew her nose and had some blood on the tissue, concerned about this, reassured her and will update Dr. Doy Hutching.

## 2014-12-02 NOTE — Care Management (Signed)
Patient to be discharged on xarelto. 30 free trial card provided to patient.  I offered to call her pharmacy to see what wer monthly copay would be. Patient refused.

## 2014-12-02 NOTE — Progress Notes (Signed)
Dr. Doy Hutching made aware of nose bleeding, will update pt.

## 2014-12-02 NOTE — Discharge Instructions (Signed)
Needs Home Health

## 2014-12-02 NOTE — Progress Notes (Signed)
Pt discharged to home via wc.  Instructions  given to pt.  Questions answered.  No distress.  

## 2015-01-03 ENCOUNTER — Emergency Department: Payer: Medicare PPO

## 2015-01-03 ENCOUNTER — Emergency Department
Admission: EM | Admit: 2015-01-03 | Discharge: 2015-01-03 | Disposition: A | Discharge status: Home / Self Care | Payer: Medicare PPO | Attending: Emergency Medicine | Admitting: Emergency Medicine

## 2015-01-03 DIAGNOSIS — Y9289 Other specified places as the place of occurrence of the external cause: Secondary | ICD-10-CM | POA: Diagnosis not present

## 2015-01-03 DIAGNOSIS — Z23 Encounter for immunization: Secondary | ICD-10-CM | POA: Diagnosis not present

## 2015-01-03 DIAGNOSIS — R55 Syncope and collapse: Secondary | ICD-10-CM

## 2015-01-03 DIAGNOSIS — Y998 Other external cause status: Secondary | ICD-10-CM | POA: Diagnosis not present

## 2015-01-03 DIAGNOSIS — S0101XA Laceration without foreign body of scalp, initial encounter: Secondary | ICD-10-CM | POA: Diagnosis not present

## 2015-01-03 DIAGNOSIS — Y93G3 Activity, cooking and baking: Secondary | ICD-10-CM | POA: Insufficient documentation

## 2015-01-03 DIAGNOSIS — W1839XA Other fall on same level, initial encounter: Secondary | ICD-10-CM | POA: Insufficient documentation

## 2015-01-03 DIAGNOSIS — S4991XA Unspecified injury of right shoulder and upper arm, initial encounter: Secondary | ICD-10-CM | POA: Insufficient documentation

## 2015-01-03 LAB — COMPREHENSIVE METABOLIC PANEL
ALK PHOS: 42 U/L (ref 38–126)
ALT: 22 U/L (ref 14–54)
ANION GAP: 8 (ref 5–15)
AST: 30 U/L (ref 15–41)
Albumin: 3.9 g/dL (ref 3.5–5.0)
BILIRUBIN TOTAL: 1.1 mg/dL (ref 0.3–1.2)
BUN: 11 mg/dL (ref 6–20)
CALCIUM: 9.1 mg/dL (ref 8.9–10.3)
CO2: 26 mmol/L (ref 22–32)
Chloride: 106 mmol/L (ref 101–111)
Creatinine, Ser: 0.93 mg/dL (ref 0.44–1.00)
GFR calc non Af Amer: 57 mL/min — ABNORMAL LOW (ref >60)
Glucose, Bld: 101 mg/dL — ABNORMAL HIGH (ref 65–99)
Potassium: 4.2 mmol/L (ref 3.5–5.1)
SODIUM: 140 mmol/L (ref 135–145)
TOTAL PROTEIN: 7.1 g/dL (ref 6.5–8.1)

## 2015-01-03 LAB — CBC WITH DIFFERENTIAL/PLATELET
Basophils Absolute: 0.1 10*3/uL (ref 0–0.1)
Basophils Relative: 1 %
EOS ABS: 0.3 10*3/uL (ref 0–0.7)
Eosinophils Relative: 3 %
HCT: 43.4 % (ref 35.0–47.0)
HEMOGLOBIN: 14.4 g/dL (ref 12.0–16.0)
LYMPHS ABS: 1.8 10*3/uL (ref 1.0–3.6)
Lymphocytes Relative: 17 %
MCH: 31.5 pg (ref 26.0–34.0)
MCHC: 33.1 g/dL (ref 32.0–36.0)
MCV: 95.4 fL (ref 80.0–100.0)
MONO ABS: 0.7 10*3/uL (ref 0.2–0.9)
MONOS PCT: 7 %
NEUTROS PCT: 72 %
Neutro Abs: 7.5 10*3/uL — ABNORMAL HIGH (ref 1.4–6.5)
Platelets: 257 10*3/uL (ref 150–440)
RBC: 4.56 MIL/uL (ref 3.80–5.20)
RDW: 14.5 % (ref 11.5–14.5)
WBC: 10.3 10*3/uL (ref 3.6–11.0)

## 2015-01-03 LAB — TROPONIN I: Troponin I: <0.03 ng/mL (ref <0.031)

## 2015-01-03 MED ORDER — LIDOCAINE-EPINEPHRINE 2 %-1:100000 IJ SOLN
30.0000 mL | Freq: Once | INTRAMUSCULAR | Status: DC
  Filled 2015-01-03: qty 30

## 2015-01-03 MED ORDER — LIDOCAINE-EPINEPHRINE (PF) 1 %-1:200000 IJ SOLN
INTRAMUSCULAR | Status: AC
  Administered 2015-01-03: 30 mL
  Filled 2015-01-03: qty 30

## 2015-01-03 MED ORDER — TETANUS-DIPHTH-ACELL PERTUSSIS 5-2.5-18.5 LF-MCG/0.5 IM SUSP
0.5000 mL | Freq: Once | INTRAMUSCULAR | Status: AC
  Administered 2015-01-03: 0.5 mL via INTRAMUSCULAR
  Filled 2015-01-03: qty 0.5

## 2015-01-03 NOTE — ED Notes (Addendum)
Pt with husband to triage with reports that pt had two syncopal episodes this am. The first episode pts husband caught pt, the second time pt fell and hit back of her head and rt arm. Pt does not remember falling. Pt a/o x 4 in triage. Pt reports use of blood thinner.

## 2015-01-03 NOTE — ED Provider Notes (Signed)
[facility] Emergency Department Provider Note     Time seen: ----------------------------------------- 10:33 AM on 01/03/2015 -----------------------------------------    I have reviewed the triage vital signs and the nursing notes.   HISTORY  Chief Complaint Near Syncope    HPI Cheryl Rosales is a 80 y.o. female who presents ER with 2 syncopal episodes this morning. Patient states she was standing and cooking, began to feel hot and states she should've sat down. This is happened to her before, lasting episode was a year and a half ago. Husband heard her fall and went to go get her, she is complaining of right shoulder shoulder pain and head pain. She is not falling. She currently takesXarelto for recent diagnosis of PE.   Past Medical History  Diagnosis Date  . Arthritis     Patient Active Problem List   Diagnosis Date Noted  . Pulmonary embolism 11/29/2014  . Pulmonary emboli 11/29/2014    Past Surgical History  Procedure Laterality Date  . Appendectomy      Allergies Mobic; Codeine; Demerol; and Evista  Social History Social History  Substance Use Topics  . Smoking status: Never Smoker   . Smokeless tobacco: Not on file  . Alcohol Use: No    Review of Systems Constitutional: Negative for fever. Eyes: Negative for visual changes. ENT: Negative for sore throat. Cardiovascular: Negative for chest pain. Respiratory: Negative for shortness of breath. Gastrointestinal: Negative for abdominal pain, vomiting and diarrhea. Genitourinary: Negative for dysuria. Musculoskeletal: Positive for right shoulder pain Skin: Positive for lacerations and scalp Neurological: Positive for headache  10-point ROS otherwise negative.  ____________________________________________   PHYSICAL EXAM:  VITAL SIGNS: ED Triage Vitals  Enc Vitals Group     BP 01/03/15 1021 188/101 mmHg     Pulse Rate 01/03/15 1021 65     Resp 01/03/15 1021 20    Temp 01/03/15 1021 97.4 F (36.3 C)     Temp Source 01/03/15 1021 Oral     SpO2 01/03/15 1021 100 %     Weight 01/03/15 1021 149 lb (67.586 kg)     Height 01/03/15 1021 5\' 6"  (1.676 m)     Head Cir --      Peak Flow --      Pain Score 01/03/15 1022 3     Pain Loc --      Pain Edu? --      Excl. in [place]? --     Constitutional: Alert and oriented. Well appearing and in no distress. Eyes: Conjunctivae are normal. PERRL. Normal extraocular movements. ENT   Head: There is a small puncture wound on the right eyebrow over the lateral aspect. There is a left-sided parietal scalp laceration of 2 cm.   Nose: No congestion/rhinnorhea.   Mouth/Throat: Mucous membranes are moist.   Neck: No stridor. Cardiovascular: Normal rate, regular rhythm. Normal and symmetric distal pulses are present in all extremities. No murmurs, rubs, or gallops. Respiratory: Normal respiratory effort without tachypnea nor retractions. Breath sounds are clear and equal bilaterally. No wheezes/rales/rhonchi. Gastrointestinal: Soft and nontender. No distention. No abdominal bruits.  Musculoskeletal: Mild pain with range of motion right shoulder. No contusions or abrasions are noted. Neurologic:  Normal speech and language. No gross focal neurologic deficits are appreciated. Speech is normal. No gait instability. Skin:  Scalp lacerations are evident Psychiatric: Mood and affect are normal. Speech and behavior are normal. Patient exhibits appropriate insight and judgment. ____________________________________________  EKG: Interpreted by me. Normal sinus rhythm with  a rate of 62 bpm, borderline PR interval prolongation, normal curious with, normal QT interval.  ____________________________________________  ED COURSE:  Pertinent labs & imaging results that were available during my care of the patient were reviewed by me and considered in my medical decision making (see chart for details). She is in no acute  distress, will check basic labs and CT had. ____________________________________________    LABS (pertinent positives/negatives)  Labs Reviewed  CBC WITH DIFFERENTIAL/PLATELET - Abnormal; Notable for the following:    Neutro Abs 7.5 (*)    All other components within normal limits  COMPREHENSIVE METABOLIC PANEL - Abnormal; Notable for the following:    Glucose, Bld 101 (*)    GFR calc non Af Amer 57 (*)    All other components within normal limits  TROPONIN I  URINALYSIS COMPLETEWITH MICROSCOPIC ([facility] ONLY)   LACERATION REPAIR Performed by: Earleen Newport Authorized by: Lenise Arena E Consent: Verbal consent obtained. Risks and benefits: risks, benefits and alternatives were discussed Consent given by: patient Patient identity confirmed: provided demographic data Prepped and Draped in normal sterile fashion Wound explored  Laceration Location: Parietal scalp  Laceration Length: 3cm  No Foreign Bodies seen or palpated  Anesthesia: local infiltration  Local anesthetic: lidocaine 1 % with epinephrine  Anesthetic total: 3 ml  Irrigation method: syringe Amount of cleaning: standard  Skin closure: Staples   Number of staples: 2    Patient tolerance: Patient tolerated the procedure well with no immediate complications.   RADIOLOGY Images were viewed by me  CT head IMPRESSION: Brain atrophy and chronic white matter ischemic changes. No acute process by noncontrast CT. ____________________________________________  FINAL ASSESSMENT AND PLAN  Fall, head injury, scalp laceration, syncope  Plan: Patient with labs and imaging as dictated above. Patient with a chronic history of vasovagal syncope. She declines hospitalization at this time. Stable for outpatient follow-up.   Earleen Newport, MD   Earleen Newport, MD 01/03/15 [phone]

## 2015-01-03 NOTE — Discharge Instructions (Signed)
Syncope °Syncope is a medical term for fainting or passing out. This means you lose consciousness and drop to the ground. People are generally unconscious for less than 5 minutes. You may have some muscle twitches for up to 15 seconds before waking up and returning to normal. Syncope occurs more often in older adults, but it can happen to anyone. While most causes of syncope are not dangerous, syncope can be a sign of a serious medical problem. It is important to seek medical care.  °CAUSES  °Syncope is caused by a sudden drop in blood flow to the brain. The specific cause is often not determined. Factors that can bring on syncope include: °· Taking medicines that lower blood pressure. °· Sudden changes in posture, such as standing up quickly. °· Taking more medicine than prescribed. °· Standing in one place for too long. °· Seizure disorders. °· Dehydration and excessive exposure to heat. °· Low blood sugar (hypoglycemia). °· Straining to have a bowel movement. °· Heart disease, irregular heartbeat, or other circulatory problems. °· Fear, emotional distress, seeing blood, or severe pain. °SYMPTOMS  °Right before fainting, you may: °· Feel dizzy or light-headed. °· Feel nauseous. °· See all white or all black in your field of vision. °· Have cold, clammy skin. °DIAGNOSIS  °Your health care provider will ask about your symptoms, perform a physical exam, and perform an electrocardiogram (ECG) to record the electrical activity of your heart. Your health care provider may also perform other heart or blood tests to determine the cause of your syncope which may include: °· Transthoracic echocardiogram (TTE). During echocardiography, sound waves are used to evaluate how blood flows through your heart. °· Transesophageal echocardiogram (TEE). °· Cardiac monitoring. This allows your health care provider to monitor your heart rate and rhythm in real time. °· Holter monitor. This is a portable device that records your  heartbeat and can help diagnose heart arrhythmias. It allows your health care provider to track your heart activity for several days, if needed. °· Stress tests by exercise or by giving medicine that makes the heart beat faster. °TREATMENT  °In most cases, no treatment is needed. Depending on the cause of your syncope, your health care provider may recommend changing or stopping some of your medicines. °HOME CARE INSTRUCTIONS °· Have someone stay with you until you feel stable. °· Do not drive, use machinery, or play sports until your health care provider says it is okay. °· Keep all follow-up appointments as directed by your health care provider. °· Lie down right away if you start feeling like you might faint. Breathe deeply and steadily. Wait until all the symptoms have passed. °· Drink enough fluids to keep your urine clear or pale yellow. °· If you are taking blood pressure or heart medicine, get up slowly and take several minutes to sit and then stand. This can reduce dizziness. °SEEK IMMEDIATE MEDICAL CARE IF:  °· You have a severe headache. °· You have unusual pain in the chest, abdomen, or back. °· You are bleeding from your mouth or rectum, or you have black or tarry stool. °· You have an irregular or very fast heartbeat. °· You have pain with breathing. °· You have repeated fainting or seizure-like jerking during an episode. °· You faint when sitting or lying down. °· You have confusion. °· You have trouble walking. °· You have severe weakness. °· You have vision problems. °If you fainted, call your local emergency services (911 in [place] Do not drive   yourself to the hospital.  MAKE SURE YOU:  Understand these instructions.  Will watch your condition.  Will get help right away if you are not doing well or get worse. Document Released: 04/05/2005 Document Revised: 04/10/2013 Document Reviewed: 06/04/2011 [facility] Patient Information 2015 [place], [place]. This information is not intended to replace  advice given to you by your health care provider. Make sure you discuss any questions you have with your health care provider. Staple Care and Removal Your caregiver has used staples today to repair your wound. Staples are used to help a wound heal faster by holding the edges of the wound together. The staples can be removed when the wound has healed well enough to stay together after the staples are removed. A dressing (wound covering), depending on the location of the wound, may have been applied. This may be changed once per day or as instructed. If the dressing sticks, it may be soaked off with soapy water or hydrogen peroxide. Only take over-the-counter or prescription medicines for pain, discomfort, or fever as directed by your caregiver.  If you did not receive a tetanus shot today because you did not recall when your last one was given, check with your caregiver when you have your staples removed to determine if one is needed. Return to your caregiver's office in 1 week or as suggested to have your staples removed. SEEK IMMEDIATE MEDICAL CARE IF:   You have redness, swelling, or increasing pain in the wound.  You have pus coming from the wound.  You have a fever.  You notice a bad smell coming from the wound or dressing.  Your wound edges break open after staples have been removed. Document Released: 12/29/2000 Document Revised: 06/28/2011 Document Reviewed: 01/13/2005 [facility] Patient Information 2015 [place], [place]. This information is not intended to replace advice given to you by your health care provider. Make sure you discuss any questions you have with your health care provider.

## 2015-01-14 ENCOUNTER — Encounter: Payer: Self-pay | Admitting: Emergency Medicine

## 2015-01-14 ENCOUNTER — Emergency Department
Admission: EM | Admit: 2015-01-14 | Discharge: 2015-01-14 | Disposition: A | Discharge status: Home / Self Care | Payer: Medicare PPO | Attending: Emergency Medicine | Admitting: Emergency Medicine

## 2015-01-14 DIAGNOSIS — X58XXXD Exposure to other specified factors, subsequent encounter: Secondary | ICD-10-CM | POA: Diagnosis not present

## 2015-01-14 DIAGNOSIS — R55 Syncope and collapse: Secondary | ICD-10-CM | POA: Insufficient documentation

## 2015-01-14 DIAGNOSIS — S0101XD Laceration without foreign body of scalp, subsequent encounter: Secondary | ICD-10-CM | POA: Diagnosis not present

## 2015-01-14 DIAGNOSIS — Z4802 Encounter for removal of sutures: Secondary | ICD-10-CM | POA: Diagnosis present

## 2015-01-14 DIAGNOSIS — Z79899 Other long term (current) drug therapy: Secondary | ICD-10-CM | POA: Insufficient documentation

## 2015-01-14 DIAGNOSIS — Z7901 Long term (current) use of anticoagulants: Secondary | ICD-10-CM | POA: Insufficient documentation

## 2015-01-14 NOTE — ED Notes (Signed)
Here for staple removal

## 2015-01-14 NOTE — ED Provider Notes (Signed)
CSN: [id number]     Arrival date & time 01/14/15  1130 History   First MD Initiated Contact with Patient 01/14/15 1135     Chief Complaint  Patient presents with  . Suture / Staple Removal    HPI Comments: 80 year old female presents today for staple removal. Was seen at this facility on 01/03/15 for scalp laceration following a syncopal episode. Two staples were placed in posterior scalp at that time. No drainage or bleeding from the site of the wound.   Patient is a 80 y.o. female presenting with suture removal. The history is provided by the patient.  Suture / Staple Removal This is a new problem. The current episode started 1 to 4 weeks ago. The problem has been unchanged. Nothing aggravates the symptoms.    Past Medical History  Diagnosis Date  . Arthritis    Past Surgical History  Procedure Laterality Date  . Appendectomy     Family History  Problem Relation Age of Onset  . Breast cancer Mother    Social History  Substance Use Topics  . Smoking status: Never Smoker   . Smokeless tobacco: None  . Alcohol Use: No   OB History    No data available     Review of Systems  Skin: Positive for wound.  All other systems reviewed and are negative.     Allergies  Mobic; Codeine; Demerol; and Evista  Home Medications   Prior to Admission medications   Medication Sig Start Date End Date Taking? Authorizing Provider  acetaminophen (ARTHRITIS PAIN) 650 MG CR tablet Take 650 mg by mouth every 8 (eight) hours as needed for pain.    Historical Provider, MD  Biotin 5 MG TABS Take 1 tablet by mouth daily.    Historical Provider, MD  Calcium Carb-Cholecalciferol (CALCIUM 600 + D PO) Take 1 tablet by mouth 2 (two) times daily.    Historical Provider, MD  cholecalciferol (VITAMIN D) 1000 UNITS tablet Take 1,000 Units by mouth daily.    Historical Provider, MD  famotidine (PEPCID) 20 MG tablet Take 1 tablet (20 mg total) by mouth 2 (two) times daily. 12/02/14   Idelle Crouch, MD   metoprolol tartrate (LOPRESSOR) 25 MG tablet Take 0.5 tablets (12.5 mg total) by mouth 2 (two) times daily. Patient taking differently: Take 25 mg by mouth daily.  12/02/14   Idelle Crouch, MD  Multiple Vitamins-Minerals (SENIOR MULTIVITAMIN PLUS) TABS Take 1 tablet by mouth daily.    Historical Provider, MD  Omega-3 Fatty Acids (FISH OIL) 1200 MG CAPS Take 1 capsule by mouth 2 (two) times daily.    Historical Provider, MD  rivaroxaban (XARELTO) 20 MG TABS tablet Take 20 mg by mouth daily with breakfast.     Historical Provider, MD   BP 170/80 mmHg  Pulse 78  Temp(Src) 98 F (36.7 C) (Oral)  Resp 18  Ht 5\' 6"  (1.676 m)  Wt 149 lb (67.586 kg)  BMI 24.06 kg/m2  SpO2 98% Physical Exam  Constitutional: She is oriented to person, place, and time. She appears well-developed and well-nourished.  HENT:  Head: Normocephalic.  Linear abrasion, healing in midline posterior scalp. Two staples in place, wound well approximated.  Neurological: She is alert and oriented to person, place, and time.  Skin: Skin is warm and dry.  Psychiatric: She has a normal mood and affect. Her behavior is normal. Judgment and thought content normal.    ED Course  Procedures (including critical care time) Labs  Review Labs Reviewed - No data to display  Imaging Review No results found. I have personally reviewed and evaluated these images and lab results as part of my medical decision-making.   EKG Interpretation None     Two staples removed by this provider, pt tolerated well MDM   Final diagnoses:  Scalp laceration, subsequent encounter        Corliss Parish, PA-C 01/14/15 1217  Delman Kitten, MD 01/14/15 [phone]

## 2015-01-30 ENCOUNTER — Other Ambulatory Visit: Payer: Self-pay | Admitting: Internal Medicine

## 2015-01-30 DIAGNOSIS — M79661 Pain in right lower leg: Secondary | ICD-10-CM

## 2015-01-31 ENCOUNTER — Ambulatory Visit
Admission: RE | Admit: 2015-01-31 | Discharge: 2015-01-31 | Disposition: A | Discharge status: Home / Self Care | Payer: Medicare PPO | Source: Ambulatory Visit | Attending: Internal Medicine | Admitting: Internal Medicine

## 2015-01-31 DIAGNOSIS — M79661 Pain in right lower leg: Secondary | ICD-10-CM | POA: Diagnosis not present

## 2015-01-31 DIAGNOSIS — M7121 Synovial cyst of popliteal space [Baker], right knee: Secondary | ICD-10-CM | POA: Diagnosis not present

## 2015-05-02 ENCOUNTER — Other Ambulatory Visit: Payer: Self-pay | Admitting: Internal Medicine

## 2015-05-02 DIAGNOSIS — R059 Cough, unspecified: Secondary | ICD-10-CM

## 2015-05-02 DIAGNOSIS — R0609 Other forms of dyspnea: Secondary | ICD-10-CM

## 2015-05-02 DIAGNOSIS — R05 Cough: Secondary | ICD-10-CM

## 2015-05-05 ENCOUNTER — Other Ambulatory Visit: Payer: Self-pay | Admitting: Internal Medicine

## 2015-05-05 DIAGNOSIS — Z1231 Encounter for screening mammogram for malignant neoplasm of breast: Secondary | ICD-10-CM

## 2015-05-13 ENCOUNTER — Ambulatory Visit
Admission: RE | Admit: 2015-05-13 | Discharge: 2015-05-13 | Disposition: A | Discharge status: Home / Self Care | Payer: Medicare Other | Source: Ambulatory Visit | Attending: Internal Medicine | Admitting: Internal Medicine

## 2015-05-13 DIAGNOSIS — R918 Other nonspecific abnormal finding of lung field: Secondary | ICD-10-CM | POA: Insufficient documentation

## 2015-05-13 DIAGNOSIS — R05 Cough: Secondary | ICD-10-CM | POA: Diagnosis not present

## 2015-05-13 DIAGNOSIS — Z86711 Personal history of pulmonary embolism: Secondary | ICD-10-CM | POA: Insufficient documentation

## 2015-05-13 DIAGNOSIS — R059 Cough, unspecified: Secondary | ICD-10-CM

## 2015-05-13 DIAGNOSIS — R0609 Other forms of dyspnea: Secondary | ICD-10-CM | POA: Insufficient documentation

## 2015-05-13 HISTORY — DX: Malignant (primary) neoplasm, unspecified: C80.1

## 2015-05-13 MED ORDER — IOHEXOL 350 MG/ML SOLN
75.0000 mL | Freq: Once | INTRAVENOUS | Status: AC | PRN
  Administered 2015-05-13: 75 mL via INTRAVENOUS

## 2015-06-19 ENCOUNTER — Other Ambulatory Visit: Payer: Self-pay | Admitting: Internal Medicine

## 2015-06-19 ENCOUNTER — Ambulatory Visit
Admission: RE | Admit: 2015-06-19 | Discharge: 2015-06-19 | Disposition: A | Discharge status: Home / Self Care | Payer: Medicare Other | Source: Ambulatory Visit | Attending: Internal Medicine | Admitting: Internal Medicine

## 2015-06-19 DIAGNOSIS — Z1231 Encounter for screening mammogram for malignant neoplasm of breast: Secondary | ICD-10-CM | POA: Insufficient documentation

## 2016-01-29 ENCOUNTER — Encounter: Payer: Self-pay | Admitting: *Deleted

## 2016-02-02 NOTE — Discharge Instructions (Signed)

## 2016-02-04 ENCOUNTER — Ambulatory Visit: Payer: Medicare Other | Admitting: Anesthesiology

## 2016-02-04 ENCOUNTER — Encounter
Admission: RE | Disposition: A | Discharge status: Home / Self Care | Payer: Self-pay | Source: Ambulatory Visit | Attending: Ophthalmology

## 2016-02-04 ENCOUNTER — Ambulatory Visit
Admission: RE | Admit: 2016-02-04 | Discharge: 2016-02-04 | Disposition: A | Discharge status: Home / Self Care | Payer: Medicare Other | Source: Ambulatory Visit | Attending: Ophthalmology | Admitting: Ophthalmology

## 2016-02-04 DIAGNOSIS — Z87891 Personal history of nicotine dependence: Secondary | ICD-10-CM | POA: Diagnosis not present

## 2016-02-04 DIAGNOSIS — M810 Age-related osteoporosis without current pathological fracture: Secondary | ICD-10-CM | POA: Insufficient documentation

## 2016-02-04 DIAGNOSIS — Z86711 Personal history of pulmonary embolism: Secondary | ICD-10-CM | POA: Insufficient documentation

## 2016-02-04 DIAGNOSIS — J309 Allergic rhinitis, unspecified: Secondary | ICD-10-CM | POA: Diagnosis not present

## 2016-02-04 DIAGNOSIS — I10 Essential (primary) hypertension: Secondary | ICD-10-CM | POA: Insufficient documentation

## 2016-02-04 DIAGNOSIS — M1990 Unspecified osteoarthritis, unspecified site: Secondary | ICD-10-CM | POA: Insufficient documentation

## 2016-02-04 DIAGNOSIS — Z79899 Other long term (current) drug therapy: Secondary | ICD-10-CM | POA: Diagnosis not present

## 2016-02-04 DIAGNOSIS — Z85828 Personal history of other malignant neoplasm of skin: Secondary | ICD-10-CM | POA: Diagnosis not present

## 2016-02-04 DIAGNOSIS — H2512 Age-related nuclear cataract, left eye: Secondary | ICD-10-CM | POA: Diagnosis present

## 2016-02-04 HISTORY — DX: Personal history of other venous thrombosis and embolism: Z86.718

## 2016-02-04 HISTORY — DX: Syncope and collapse: R55

## 2016-02-04 HISTORY — PX: CATARACT EXTRACTION W/PHACO: SHX586

## 2016-02-04 HISTORY — DX: Essential (primary) hypertension: I10

## 2016-02-04 SURGERY — PHACOEMULSIFICATION, CATARACT, WITH IOL INSERTION
Anesthesia: Monitor Anesthesia Care | Site: Eye | Laterality: Left | Wound class: Clean

## 2016-02-04 MED ORDER — LIDOCAINE HCL (PF) 4 % IJ SOLN
INTRAOCULAR | Status: DC | PRN
  Administered 2016-02-04: 1 mL via OPHTHALMIC

## 2016-02-04 MED ORDER — MOXIFLOXACIN HCL 0.5 % OP SOLN
1.0000 [drp] | OPHTHALMIC | Status: DC | PRN
  Administered 2016-02-04 (×2): 1 [drp] via OPHTHALMIC

## 2016-02-04 MED ORDER — CEFUROXIME OPHTHALMIC INJECTION 1 MG/0.1 ML
INJECTION | OPHTHALMIC | Status: DC | PRN
  Administered 2016-02-04: 0.1 mL via INTRACAMERAL

## 2016-02-04 MED ORDER — BRIMONIDINE TARTRATE 0.2 % OP SOLN
OPHTHALMIC | Status: DC | PRN
  Administered 2016-02-04: 1 [drp] via OPHTHALMIC

## 2016-02-04 MED ORDER — ARMC OPHTHALMIC DILATING DROPS
1.0000 "application " | OPHTHALMIC | Status: DC | PRN
  Administered 2016-02-04 (×3): 1 via OPHTHALMIC

## 2016-02-04 MED ORDER — TIMOLOL MALEATE 0.5 % OP SOLN
OPHTHALMIC | Status: DC | PRN
  Administered 2016-02-04: 1 [drp] via OPHTHALMIC

## 2016-02-04 MED ORDER — MIDAZOLAM HCL 2 MG/2ML IJ SOLN
INTRAMUSCULAR | Status: DC | PRN
  Administered 2016-02-04: 2 mg via INTRAVENOUS

## 2016-02-04 MED ORDER — ACETAMINOPHEN 325 MG PO TABS: 325.0000 mg | ORAL_TABLET | ORAL | Status: DC | PRN

## 2016-02-04 MED ORDER — NA HYALUR & NA CHOND-NA HYALUR 0.4-0.35 ML IO KIT
PACK | INTRAOCULAR | Status: DC | PRN
  Administered 2016-02-04: 1 mL via INTRAOCULAR

## 2016-02-04 MED ORDER — LACTATED RINGERS IV SOLN: INTRAVENOUS | Status: DC

## 2016-02-04 MED ORDER — FENTANYL CITRATE (PF) 100 MCG/2ML IJ SOLN
INTRAMUSCULAR | Status: DC | PRN
  Administered 2016-02-04: 50 ug via INTRAVENOUS

## 2016-02-04 MED ORDER — ACETAMINOPHEN 160 MG/5ML PO SOLN
325.0000 mg | ORAL | Status: DC | PRN
Start: 2016-02-04 — End: 2016-02-04

## 2016-02-04 MED ORDER — EPINEPHRINE PF 1 MG/ML IJ SOLN
INTRAOCULAR | Status: DC | PRN
  Administered 2016-02-04: 63 mL via OPHTHALMIC

## 2016-02-04 SURGICAL SUPPLY — 27 items
CANNULA ANT/CHMB 27G (MISCELLANEOUS) ×1 IMPLANT
CANNULA ANT/CHMB 27GA (MISCELLANEOUS) ×3 IMPLANT
CARTRIDGE ABBOTT (MISCELLANEOUS) IMPLANT
GLOVE SURG LX 7.5 STRW (GLOVE) ×2
GLOVE SURG LX STRL 7.5 STRW (GLOVE) ×1 IMPLANT
GLOVE SURG TRIUMPH 8.0 PF LTX (GLOVE) ×3 IMPLANT
GOWN STRL REUS W/ TWL LRG LVL3 (GOWN DISPOSABLE) ×2 IMPLANT
GOWN STRL REUS W/TWL LRG LVL3 (GOWN DISPOSABLE) ×6
LENS IOL TECNIS ITEC 21.0 (Intraocular Lens) ×2 IMPLANT
MARKER SKIN DUAL TIP RULER LAB (MISCELLANEOUS) ×3 IMPLANT
NDL FILTER BLUNT 18X1 1/2 (NEEDLE) ×1 IMPLANT
NDL RETROBULBAR .5 NSTRL (NEEDLE) IMPLANT
NEEDLE FILTER BLUNT 18X 1/2SAF (NEEDLE) ×4
NEEDLE FILTER BLUNT 18X1 1/2 (NEEDLE) ×2 IMPLANT
PACK CATARACT BRASINGTON (MISCELLANEOUS) ×3 IMPLANT
PACK EYE AFTER SURG (MISCELLANEOUS) ×3 IMPLANT
PACK OPTHALMIC (MISCELLANEOUS) ×3 IMPLANT
RING MALYGIN 7.0 (MISCELLANEOUS) IMPLANT
SUT ETHILON 10-0 CS-B-6CS-B-6 (SUTURE)
SUT VICRYL  9 0 (SUTURE)
SUT VICRYL 9 0 (SUTURE) IMPLANT
SUTURE EHLN 10-0 CS-B-6CS-B-6 (SUTURE) IMPLANT
SYR 3ML LL SCALE MARK (SYRINGE) ×5 IMPLANT
SYR 5ML LL (SYRINGE) ×3 IMPLANT
SYR TB 1ML LUER SLIP (SYRINGE) ×3 IMPLANT
WATER STERILE IRR 250ML POUR (IV SOLUTION) ×3 IMPLANT
WIPE NON LINTING 3.25X3.25 (MISCELLANEOUS) ×3 IMPLANT

## 2016-02-04 NOTE — Anesthesia Preprocedure Evaluation (Signed)
Anesthesia Evaluation  Patient identified by MRN, date of birth, ID band Patient awake    Reviewed: Allergy & Precautions, H&P , NPO status , Patient's Chart, lab work & pertinent test results, reviewed documented beta blocker date and time   Airway Mallampati: III  TM Distance: >3 FB Neck ROM: full    Dental no notable dental hx.    Pulmonary  Hx PE   Pulmonary exam normal breath sounds clear to auscultation       Cardiovascular Exercise Tolerance: Good hypertension, On Home Beta Blockers Normal cardiovascular exam Rhythm:regular Rate:Normal     Neuro/Psych negative neurological ROS  negative psych ROS   GI/Hepatic negative GI ROS, Neg liver ROS,   Endo/Other  negative endocrine ROS  Renal/GU negative Renal ROS  negative genitourinary   Musculoskeletal   Abdominal   Peds  Hematology negative hematology ROS (+)   Anesthesia Other Findings   Reproductive/Obstetrics negative OB ROS                             Anesthesia Physical Anesthesia Plan  ASA: II  Anesthesia Plan: MAC   Post-op Pain Management:    Induction:   Airway Management Planned:   Additional Equipment:   Intra-op Plan:   Post-operative Plan:   Informed Consent: I have reviewed the patients History and Physical, chart, labs and discussed the procedure including the risks, benefits and alternatives for the proposed anesthesia with the patient or authorized representative who has indicated his/her understanding and acceptance.   Dental Advisory Given  Plan Discussed with: CRNA and Anesthesiologist  Anesthesia Plan Comments:         Anesthesia Quick Evaluation

## 2016-02-04 NOTE — Op Note (Signed)
OPERATIVE NOTE  Cheryl Rosales [id number]:[id number] 02/04/2016   PREOPERATIVE DIAGNOSIS:  Nuclear sclerotic cataract left eye. H25.12   POSTOPERATIVE DIAGNOSIS:    Nuclear sclerotic cataract left eye.     PROCEDURE:  Phacoemusification with posterior chamber intraocular lens placement of the left eye   LENS:   Implant Name Type Inv. Item Serial No. Manufacturer Lot No. LRB No. Used  LENS IOL DIOP 21.0 - [id number] Intraocular Lens LENS IOL DIOP 21.0 [id number]   Left 1        ULTRASOUND TIME: 156  % of 1 minutes 3 seconds, CDE 9.5  SURGEON:  Wyonia Hough, MD   ANESTHESIA:  Topical with tetracaine drops and 2% Xylocaine jelly, augmented with 1% preservative-free intracameral lidocaine.    COMPLICATIONS:  None.   DESCRIPTION OF PROCEDURE:  The patient was identified in the holding room and transported to the operating room and placed in the supine position under the operating microscope.  The left eye was identified as the operative eye and it was prepped and draped in the usual sterile ophthalmic fashion.   A 1 millimeter clear-corneal paracentesis was made at the 1:30 position.  0.5 ml of preservative-free 1% lidocaine was injected into the anterior chamber.  The anterior chamber was filled with Viscoat viscoelastic.  A 2.4 millimeter keratome was used to make a near-clear corneal incision at the 10:30 position.  .  A curvilinear capsulorrhexis was made with a cystotome and capsulorrhexis forceps.  Balanced salt solution was used to hydrodissect and hydrodelineate the nucleus.   Phacoemulsification was then used in stop and chop fashion to remove the lens nucleus and epinucleus.  The remaining cortex was then removed using the irrigation and aspiration handpiece. Provisc was then placed into the capsular bag to distend it for lens placement.  A lens was then injected into the capsular bag.  The remaining viscoelastic was aspirated.   Wounds were hydrated with balanced salt  solution.  The anterior chamber was inflated to a physiologic pressure with balanced salt solution.  No wound leaks were noted. Cefuroxime 0.1 ml of a 10mg /ml solution was injected into the anterior chamber for a dose of 1 mg of intracameral antibiotic at the completion of the case.   Timolol and Brimonidine drops were applied to the eye.  The patient was taken to the recovery room in stable condition without complications of anesthesia or surgery.  [name] 02/04/2016, 8:35 AM

## 2016-02-04 NOTE — Anesthesia Procedure Notes (Signed)
Procedure Name: MAC Performed by: Curly Mackowski Pre-anesthesia Checklist: Patient identified, Emergency Drugs available, Suction available, Timeout performed and Patient being monitored Patient Re-evaluated:Patient Re-evaluated prior to inductionOxygen Delivery Method: Nasal cannula Placement Confirmation: positive ETCO2       

## 2016-02-04 NOTE — H&P (Signed)
The History and Physical notes are on paper, have been signed, and are to be scanned. The patient remains stable and unchanged from the H&P.   Previous H&P reviewed, patient examined, and there are no changes.  Cheryl Rosales 02/04/2016 7:40 AM

## 2016-02-04 NOTE — Anesthesia Postprocedure Evaluation (Signed)
Anesthesia Post Note  Patient: Cheryl Rosales  Procedure(s) Performed: Procedure(s) (LRB): CATARACT EXTRACTION PHACO AND INTRAOCULAR LENS PLACEMENT ([place]) (Left)  Patient location during evaluation: PACU Anesthesia Type: MAC Level of consciousness: awake and alert Pain management: pain level controlled Vital Signs Assessment: post-procedure vital signs reviewed and stable Respiratory status: spontaneous breathing, nonlabored ventilation and respiratory function stable Cardiovascular status: stable and blood pressure returned to baseline Anesthetic complications: no    Trecia Rogers

## 2016-02-04 NOTE — Transfer of Care (Signed)
Immediate Anesthesia Transfer of Care Note  Patient: Cheryl Rosales  Procedure(s) Performed: Procedure(s) with comments: CATARACT EXTRACTION PHACO AND INTRAOCULAR LENS PLACEMENT ([place]) (Left) - PT WOULD LIKE EARLY AM  Patient Location: PACU  Anesthesia Type: MAC  Level of Consciousness: awake, alert  and patient cooperative  Airway and Oxygen Therapy: Patient Spontanous Breathing and Patient connected to supplemental oxygen  Post-op Assessment: Post-op Vital signs reviewed, Patient's Cardiovascular Status Stable, Respiratory Function Stable, Patent Airway and No signs of Nausea or vomiting  Post-op Vital Signs: Reviewed and stable  Complications: No apparent anesthesia complications

## 2016-02-05 ENCOUNTER — Encounter: Payer: Self-pay | Admitting: Ophthalmology

## 2016-02-18 NOTE — Discharge Instructions (Signed)

## 2016-02-25 ENCOUNTER — Encounter
Admission: RE | Disposition: A | Discharge status: Home / Self Care | Payer: Self-pay | Source: Ambulatory Visit | Attending: Ophthalmology

## 2016-02-25 ENCOUNTER — Ambulatory Visit
Admission: RE | Admit: 2016-02-25 | Discharge: 2016-02-25 | Disposition: A | Discharge status: Home / Self Care | Payer: Medicare Other | Source: Ambulatory Visit | Attending: Ophthalmology | Admitting: Ophthalmology

## 2016-02-25 ENCOUNTER — Ambulatory Visit: Payer: Medicare Other | Admitting: Anesthesiology

## 2016-02-25 DIAGNOSIS — M1990 Unspecified osteoarthritis, unspecified site: Secondary | ICD-10-CM | POA: Diagnosis not present

## 2016-02-25 DIAGNOSIS — Z87891 Personal history of nicotine dependence: Secondary | ICD-10-CM | POA: Insufficient documentation

## 2016-02-25 DIAGNOSIS — H2511 Age-related nuclear cataract, right eye: Secondary | ICD-10-CM | POA: Insufficient documentation

## 2016-02-25 DIAGNOSIS — Z7982 Long term (current) use of aspirin: Secondary | ICD-10-CM | POA: Diagnosis not present

## 2016-02-25 DIAGNOSIS — Z85828 Personal history of other malignant neoplasm of skin: Secondary | ICD-10-CM | POA: Diagnosis not present

## 2016-02-25 DIAGNOSIS — Z9049 Acquired absence of other specified parts of digestive tract: Secondary | ICD-10-CM | POA: Diagnosis not present

## 2016-02-25 DIAGNOSIS — Z885 Allergy status to narcotic agent status: Secondary | ICD-10-CM | POA: Diagnosis not present

## 2016-02-25 DIAGNOSIS — Z79899 Other long term (current) drug therapy: Secondary | ICD-10-CM | POA: Diagnosis not present

## 2016-02-25 DIAGNOSIS — I10 Essential (primary) hypertension: Secondary | ICD-10-CM | POA: Insufficient documentation

## 2016-02-25 DIAGNOSIS — Z9889 Other specified postprocedural states: Secondary | ICD-10-CM | POA: Diagnosis not present

## 2016-02-25 DIAGNOSIS — Z86718 Personal history of other venous thrombosis and embolism: Secondary | ICD-10-CM | POA: Insufficient documentation

## 2016-02-25 DIAGNOSIS — Z9109 Other allergy status, other than to drugs and biological substances: Secondary | ICD-10-CM | POA: Diagnosis not present

## 2016-02-25 HISTORY — PX: CATARACT EXTRACTION W/PHACO: SHX586

## 2016-02-25 SURGERY — PHACOEMULSIFICATION, CATARACT, WITH IOL INSERTION
Anesthesia: Monitor Anesthesia Care | Laterality: Right | Wound class: Clean

## 2016-02-25 MED ORDER — NA HYALUR & NA CHOND-NA HYALUR 0.4-0.35 ML IO KIT
PACK | INTRAOCULAR | Status: DC | PRN
Start: 2016-02-25 — End: 2016-02-25
  Administered 2016-02-25: 1 mL via INTRAOCULAR

## 2016-02-25 MED ORDER — LACTATED RINGERS IV SOLN: INTRAVENOUS | Status: DC

## 2016-02-25 MED ORDER — MIDAZOLAM HCL 2 MG/2ML IJ SOLN
INTRAMUSCULAR | Status: DC | PRN
  Administered 2016-02-25: 2 mg via INTRAVENOUS

## 2016-02-25 MED ORDER — TIMOLOL MALEATE 0.5 % OP SOLN
OPHTHALMIC | Status: DC | PRN
  Administered 2016-02-25: 1 [drp] via OPHTHALMIC

## 2016-02-25 MED ORDER — MOXIFLOXACIN HCL 0.5 % OP SOLN
1.0000 [drp] | OPHTHALMIC | Status: DC | PRN
  Administered 2016-02-25 (×3): 1 [drp] via OPHTHALMIC

## 2016-02-25 MED ORDER — FENTANYL CITRATE (PF) 100 MCG/2ML IJ SOLN
INTRAMUSCULAR | Status: DC | PRN
  Administered 2016-02-25: 50 ug via INTRAVENOUS

## 2016-02-25 MED ORDER — LIDOCAINE HCL (PF) 4 % IJ SOLN
INTRAOCULAR | Status: DC | PRN
  Administered 2016-02-25: 1.5 mL via OPHTHALMIC

## 2016-02-25 MED ORDER — CEFUROXIME OPHTHALMIC INJECTION 1 MG/0.1 ML
INJECTION | OPHTHALMIC | Status: DC | PRN
  Administered 2016-02-25: .3 mL via INTRACAMERAL

## 2016-02-25 MED ORDER — EPINEPHRINE PF 1 MG/ML IJ SOLN
INTRAOCULAR | Status: DC | PRN
  Administered 2016-02-25: 59 mL via OPHTHALMIC

## 2016-02-25 MED ORDER — ARMC OPHTHALMIC DILATING DROPS
1.0000 "application " | OPHTHALMIC | Status: DC | PRN
  Administered 2016-02-25 (×3): 1 via OPHTHALMIC

## 2016-02-25 MED ORDER — BRIMONIDINE TARTRATE 0.2 % OP SOLN
OPHTHALMIC | Status: DC | PRN
  Administered 2016-02-25: 1 [drp] via OPHTHALMIC

## 2016-02-25 SURGICAL SUPPLY — 27 items
CANNULA ANT/CHMB 27G (MISCELLANEOUS) ×1 IMPLANT
CANNULA ANT/CHMB 27GA (MISCELLANEOUS) ×3 IMPLANT
CARTRIDGE ABBOTT (MISCELLANEOUS) IMPLANT
GLOVE SURG LX 7.5 STRW (GLOVE) ×2
GLOVE SURG LX STRL 7.5 STRW (GLOVE) ×1 IMPLANT
GLOVE SURG TRIUMPH 8.0 PF LTX (GLOVE) ×3 IMPLANT
GOWN STRL REUS W/ TWL LRG LVL3 (GOWN DISPOSABLE) ×2 IMPLANT
GOWN STRL REUS W/TWL LRG LVL3 (GOWN DISPOSABLE) ×6
LENS IOL TECNIS ITEC 21.5 (Intraocular Lens) ×2 IMPLANT
MARKER SKIN DUAL TIP RULER LAB (MISCELLANEOUS) ×3 IMPLANT
NDL FILTER BLUNT 18X1 1/2 (NEEDLE) ×1 IMPLANT
NDL RETROBULBAR .5 NSTRL (NEEDLE) IMPLANT
NEEDLE FILTER BLUNT 18X 1/2SAF (NEEDLE) ×2
NEEDLE FILTER BLUNT 18X1 1/2 (NEEDLE) ×1 IMPLANT
PACK CATARACT BRASINGTON (MISCELLANEOUS) ×3 IMPLANT
PACK EYE AFTER SURG (MISCELLANEOUS) ×3 IMPLANT
PACK OPTHALMIC (MISCELLANEOUS) ×3 IMPLANT
RING MALYGIN 7.0 (MISCELLANEOUS) IMPLANT
SUT ETHILON 10-0 CS-B-6CS-B-6 (SUTURE)
SUT VICRYL  9 0 (SUTURE)
SUT VICRYL 9 0 (SUTURE) IMPLANT
SUTURE EHLN 10-0 CS-B-6CS-B-6 (SUTURE) IMPLANT
SYR 3ML LL SCALE MARK (SYRINGE) ×3 IMPLANT
SYR 5ML LL (SYRINGE) ×3 IMPLANT
SYR TB 1ML LUER SLIP (SYRINGE) ×3 IMPLANT
WATER STERILE IRR 250ML POUR (IV SOLUTION) ×3 IMPLANT
WIPE NON LINTING 3.25X3.25 (MISCELLANEOUS) ×3 IMPLANT

## 2016-02-25 NOTE — Transfer of Care (Signed)
Immediate Anesthesia Transfer of Care Note  Patient: Cheryl Rosales  Procedure(s) Performed: Procedure(s) with comments: CATARACT EXTRACTION PHACO AND INTRAOCULAR LENS PLACEMENT ([facility]) (Right) - PT PREFERS EARLY AM  Patient Location: PACU  Anesthesia Type: MAC  Level of Consciousness: awake, alert  and patient cooperative  Airway and Oxygen Therapy: Patient Spontanous Breathing and Patient connected to supplemental oxygen  Post-op Assessment: Post-op Vital signs reviewed, Patient's Cardiovascular Status Stable, Respiratory Function Stable, Patent Airway and No signs of Nausea or vomiting  Post-op Vital Signs: Reviewed and stable  Complications: No apparent anesthesia complications

## 2016-02-25 NOTE — Anesthesia Procedure Notes (Signed)
Performed by: Lainie Daubert Pre-anesthesia Checklist: Patient identified, Emergency Drugs available, Suction available, Timeout performed and Patient being monitored Patient Re-evaluated:Patient Re-evaluated prior to inductionOxygen Delivery Method: Circle system utilized Preoxygenation: Pre-oxygenation with 100% oxygen Intubation Type: Inhalational induction Ventilation: Mask ventilation without difficulty and Mask ventilation throughout procedure Dental Injury: Teeth and Oropharynx as per pre-operative assessment        

## 2016-02-25 NOTE — Op Note (Signed)
LOCATION:  [place]   PREOPERATIVE DIAGNOSIS:    Nuclear sclerotic cataract right eye. H25.11   POSTOPERATIVE DIAGNOSIS:  Nuclear sclerotic cataract right eye.     PROCEDURE:  Phacoemusification with posterior chamber intraocular lens placement of the right eye   LENS:   Implant Name Type Inv. Item Serial No. Manufacturer Lot No. LRB No. Used  LENS IOL DIOP 21.5 - [id number] Intraocular Lens LENS IOL DIOP 21.5 [id number]   Right 1        ULTRASOUND TIME: 15 % of 0 minutes, 56 seconds.  CDE 8.5   SURGEON:  Wyonia Hough, MD   ANESTHESIA:  Topical with tetracaine drops and 2% Xylocaine jelly, augmented with 1% preservative-free intracameral lidocaine.    COMPLICATIONS:  None.   DESCRIPTION OF PROCEDURE:  The patient was identified in the holding room and transported to the operating room and placed in the supine position under the operating microscope.  The right eye was identified as the operative eye and it was prepped and draped in the usual sterile ophthalmic fashion.   A 1 millimeter clear-corneal paracentesis was made at the 12:00 position.  0.5 ml of preservative-free 1% lidocaine was injected into the anterior chamber. The anterior chamber was filled with Viscoat viscoelastic.  A 2.4 millimeter keratome was used to make a near-clear corneal incision at the 9:00 position.  A curvilinear capsulorrhexis was made with a cystotome and capsulorrhexis forceps.  Balanced salt solution was used to hydrodissect and hydrodelineate the nucleus.   Phacoemulsification was then used in stop and chop fashion to remove the lens nucleus and epinucleus.  The remaining cortex was then removed using the irrigation and aspiration handpiece. Provisc was then placed into the capsular bag to distend it for lens placement.  A lens was then injected into the capsular bag.  The remaining viscoelastic was aspirated.   Wounds were hydrated with balanced salt solution.  The anterior  chamber was inflated to a physiologic pressure with balanced salt solution.  No wound leaks were noted. Cefuroxime 0.1 ml of a 10mg /ml solution was injected into the anterior chamber for a dose of 1 mg of intracameral antibiotic at the completion of the case.   Timolol and Brimonidine drops were applied to the eye.  The patient was taken to the recovery room in stable condition without complications of anesthesia or surgery.   Sedale Jenifer 02/25/2016, 8:45 AM

## 2016-02-25 NOTE — Anesthesia Postprocedure Evaluation (Signed)
Anesthesia Post Note  Patient: Cheryl Rosales  Procedure(s) Performed: Procedure(s) (LRB): CATARACT EXTRACTION PHACO AND INTRAOCULAR LENS PLACEMENT ([place]) (Right)  Patient location during evaluation: PACU Anesthesia Type: MAC Level of consciousness: awake and alert Pain management: pain level controlled Vital Signs Assessment: post-procedure vital signs reviewed and stable Respiratory status: spontaneous breathing Cardiovascular status: blood pressure returned to baseline Postop Assessment: no headache Anesthetic complications: no    Jaci Standard, III,  Arlayne Liggins D

## 2016-02-25 NOTE — H&P (Signed)
The History and Physical notes are on paper, have been signed, and are to be scanned. The patient remains stable and unchanged from the H&P.   Previous H&P reviewed, patient examined, and there are no changes.  Cheryl Rosales 02/25/2016 7:45 AM   

## 2016-02-25 NOTE — Anesthesia Preprocedure Evaluation (Signed)
Anesthesia Evaluation  Patient identified by MRN, date of birth, ID band Patient awake    Airway Mallampati: II  TM Distance: >3 FB Neck ROM: full    Dental no notable dental hx.    Pulmonary neg pulmonary ROS,    Pulmonary exam normal        Cardiovascular hypertension, On Medications Normal cardiovascular exam     Neuro/Psych    GI/Hepatic negative GI ROS, Neg liver ROS,   Endo/Other  negative endocrine ROS  Renal/GU negative Renal ROS     Musculoskeletal   Abdominal   Peds  Hematology negative hematology ROS (+)   Anesthesia Other Findings   Reproductive/Obstetrics                             Anesthesia Physical Anesthesia Plan  ASA: II  Anesthesia Plan: MAC   Post-op Pain Management:    Induction:   Airway Management Planned:   Additional Equipment:   Intra-op Plan:   Post-operative Plan:   Informed Consent:   Plan Discussed with:   Anesthesia Plan Comments:         Anesthesia Quick Evaluation

## 2016-02-25 NOTE — Anesthesia Procedure Notes (Signed)
Procedure Name: MAC Performed by: Hajime Asfaw Pre-anesthesia Checklist: Patient identified, Emergency Drugs available, Suction available, Timeout performed and Patient being monitored Patient Re-evaluated:Patient Re-evaluated prior to inductionOxygen Delivery Method: Nasal cannula Placement Confirmation: positive ETCO2     

## 2016-05-24 ENCOUNTER — Other Ambulatory Visit: Payer: Self-pay | Admitting: Internal Medicine

## 2016-05-24 DIAGNOSIS — Z1231 Encounter for screening mammogram for malignant neoplasm of breast: Secondary | ICD-10-CM

## 2016-06-21 ENCOUNTER — Ambulatory Visit
Admission: RE | Admit: 2016-06-21 | Discharge: 2016-06-21 | Disposition: A | Discharge status: Home / Self Care | Payer: Medicare Other | Source: Ambulatory Visit | Attending: Internal Medicine | Admitting: Internal Medicine

## 2016-06-21 DIAGNOSIS — Z1231 Encounter for screening mammogram for malignant neoplasm of breast: Secondary | ICD-10-CM

## 2016-08-20 ENCOUNTER — Other Ambulatory Visit: Payer: Self-pay | Admitting: Internal Medicine

## 2016-08-20 DIAGNOSIS — E785 Hyperlipidemia, unspecified: Secondary | ICD-10-CM | POA: Insufficient documentation

## 2016-08-20 DIAGNOSIS — R55 Syncope and collapse: Secondary | ICD-10-CM

## 2016-08-26 ENCOUNTER — Ambulatory Visit
Admission: RE | Admit: 2016-08-26 | Discharge: 2016-08-26 | Disposition: A | Discharge status: Home / Self Care | Payer: Medicare Other | Source: Ambulatory Visit | Attending: Internal Medicine | Admitting: Internal Medicine

## 2016-08-26 DIAGNOSIS — R55 Syncope and collapse: Secondary | ICD-10-CM | POA: Insufficient documentation

## 2017-04-17 IMAGING — CT CT ANGIO CHEST
1 of 2 series · 18 of 30 positions shown · IV contrast (APPLIED)
Comparison: None.

CLINICAL DATA: Worsening shortness of breath today and elevated
D-dimer.

EXAM:
CT ANGIOGRAPHY CHEST WITH CONTRAST
TECHNIQUE: Multidetector CT imaging of the chest was performed using the
standard protocol during bolus administration of intravenous
contrast. Multiplanar CT image reconstructions and MIPs were
obtained to evaluate the vascular anatomy.
CONTRAST:  65mL OMNIPAQUE IOHEXOL 350 MG/ML SOLN

[Series 5: pe 1.0 thins · axial · 0.58mm/px · z∈[-786,-494]mm · 18 of 330 slices shown]
[im 19/330  lung]
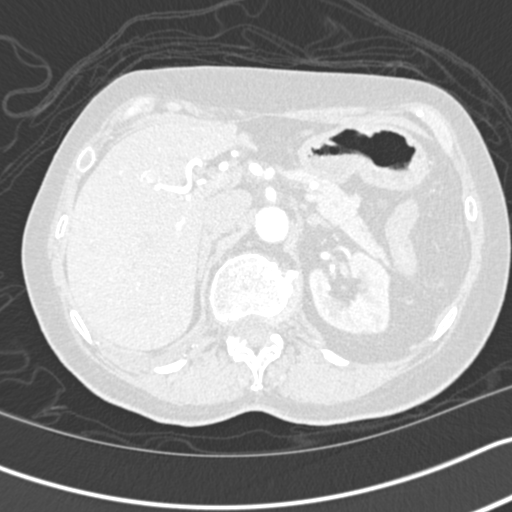
[im 37/330  mediastinal]
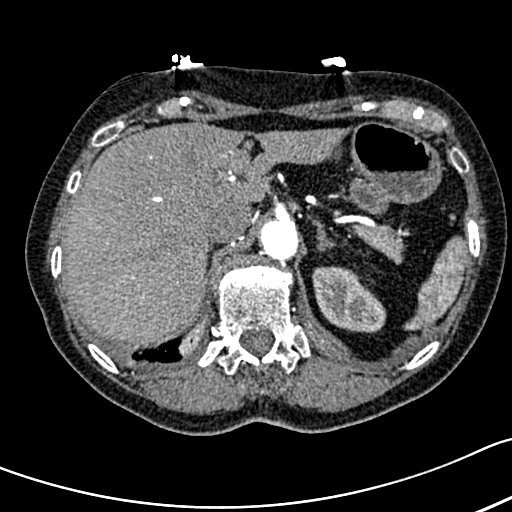
[im 55/330  lung]
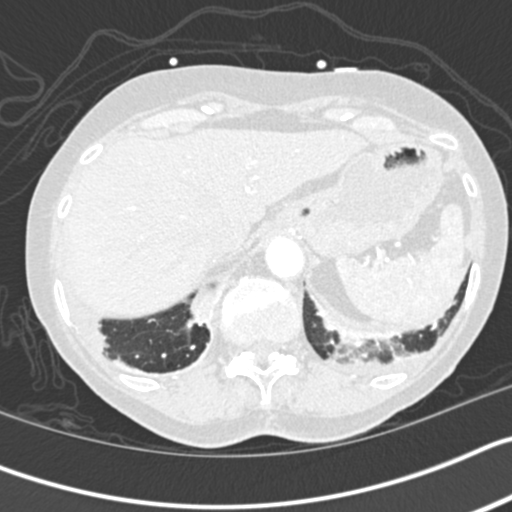
[im 74/330  mediastinal]
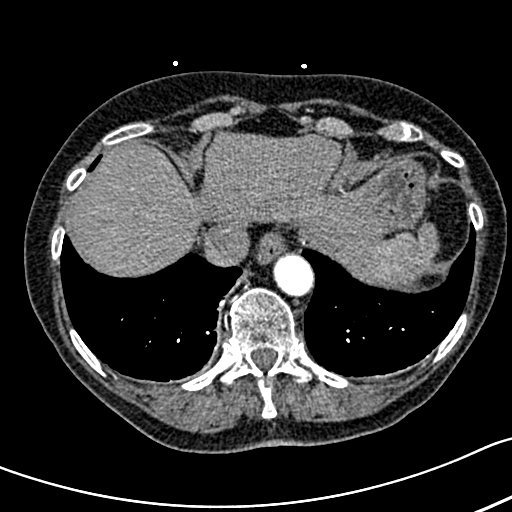
[im 92/330  lung]
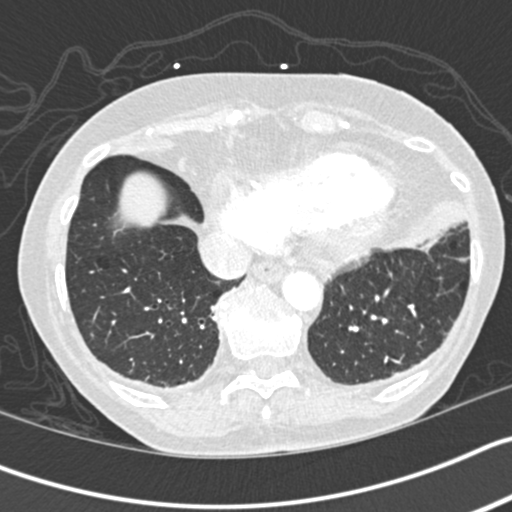
[im 110/330  mediastinal]
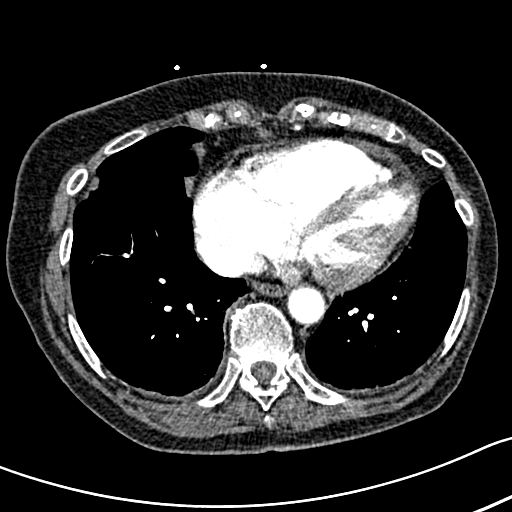
[im 128/330  lung]
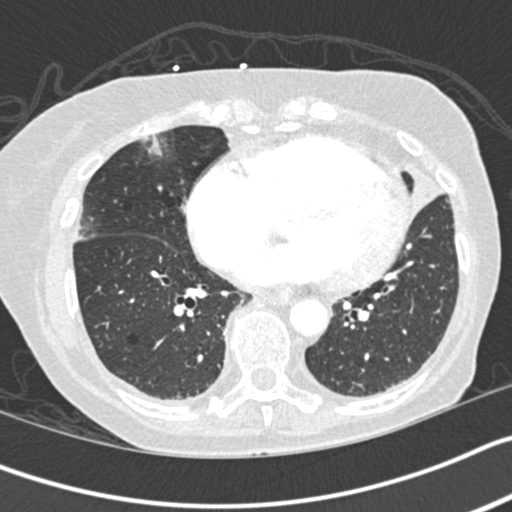
[im 147/330  mediastinal]
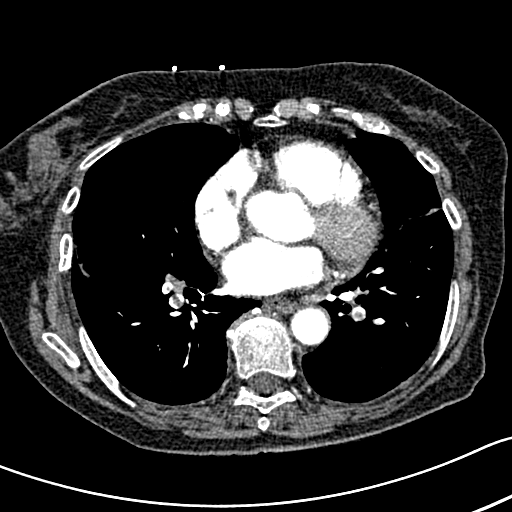
[im 156/330  lung]
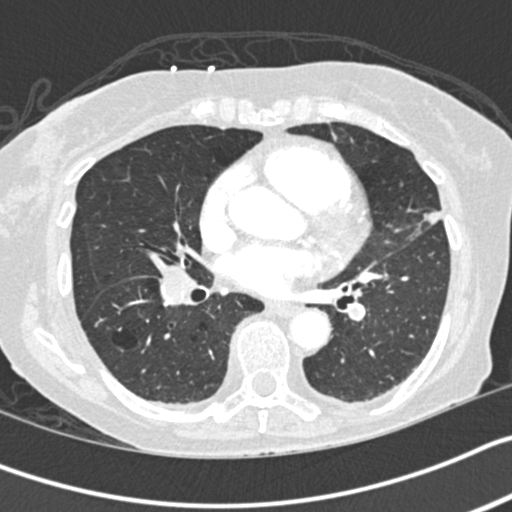
[im 165/330  mediastinal]
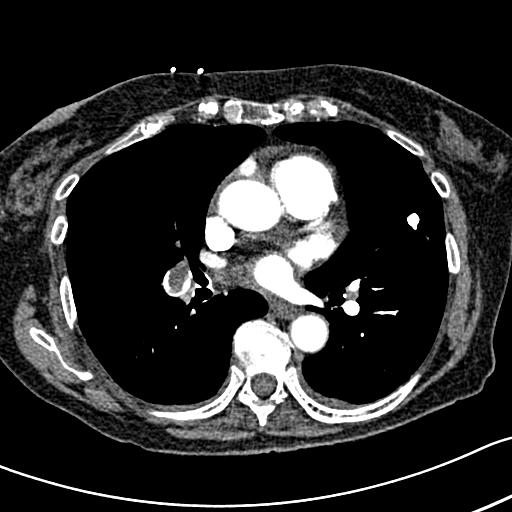
[im 183/330  lung]
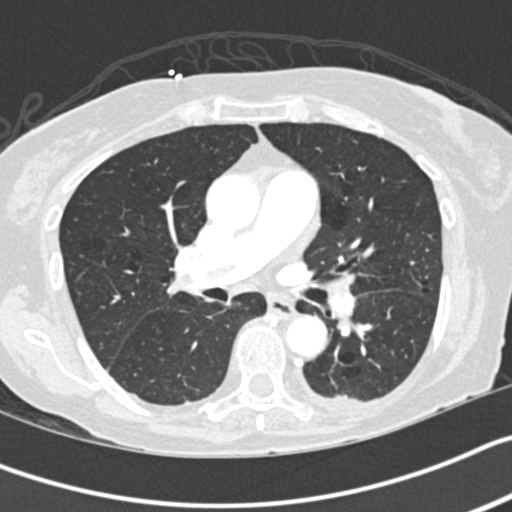
[im 202/330  mediastinal]
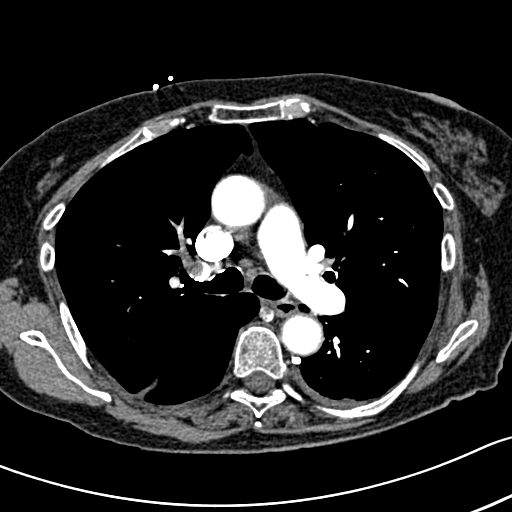
[im 220/330  lung]
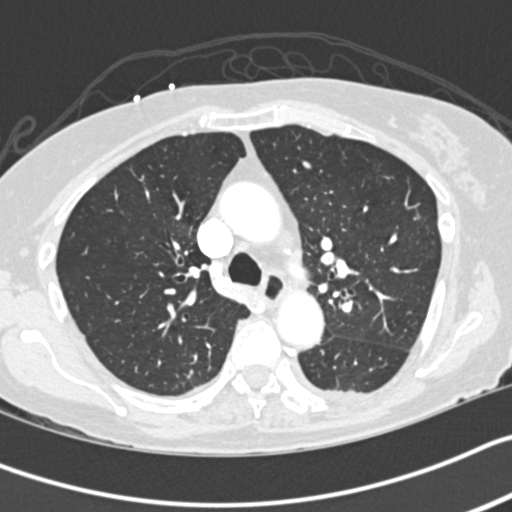
[im 238/330  mediastinal]
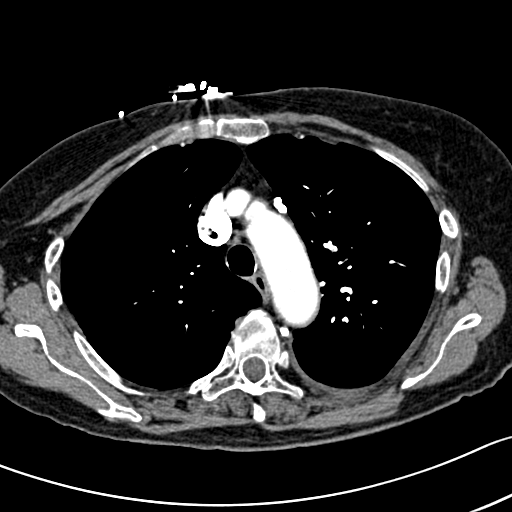
[im 256/330  lung]
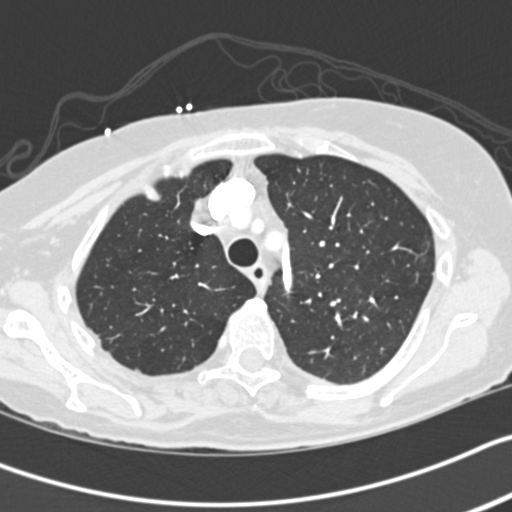
[im 275/330  mediastinal]
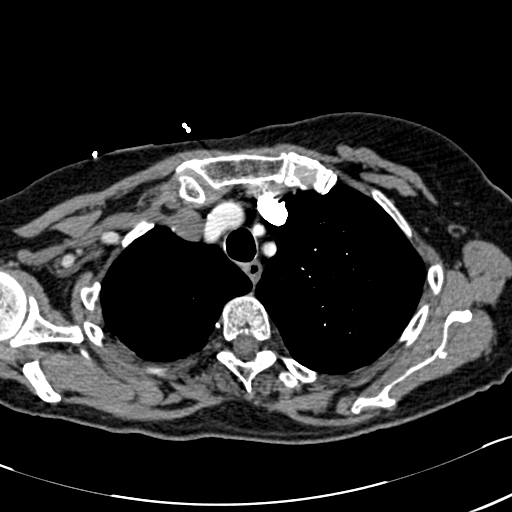
[im 293/330  lung]
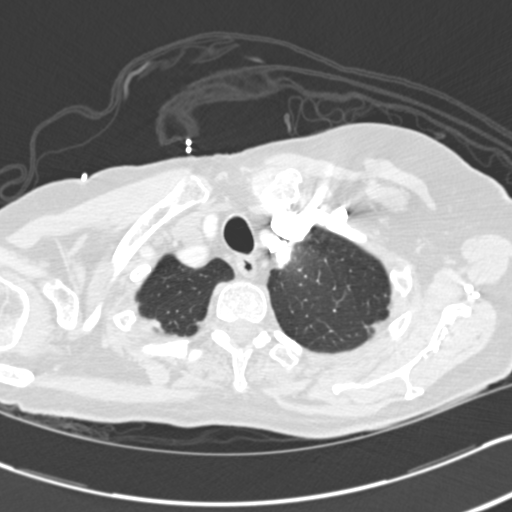
[im 311/330  mediastinal]
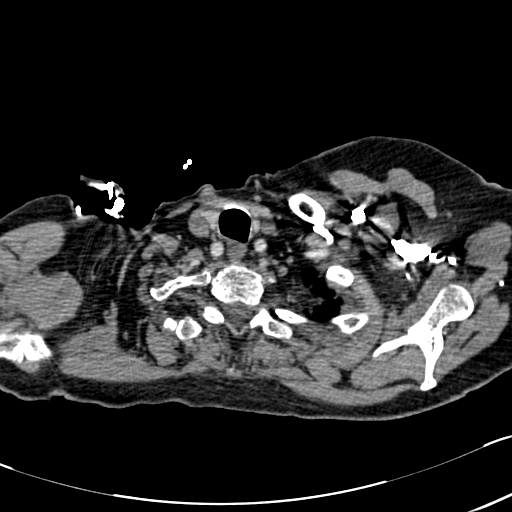

[18 of 30 positions shown; findings below may reference images not displayed]

FINDINGS: Chest wall: No breast masses, supraclavicular or axillary
lymphadenopathy. The bony thorax is intact. The thyroid gland
appears normal.

Mediastinum: The heart is normal in size. No pericardial effusion.
No mediastinal or hilar mass or lymphadenopathy. The esophagus is
grossly normal.

The aorta is normal in caliber. No dissection. Scattered
atherosclerotic calcifications.

The pulmonary arterial tree is well opacified. There is extensive
sub massive bilateral pulmonary emboli. There is evidence of right
heart strain with marked flattening of the left ventricle. The RV/
LV ratio was 1.4.

Lungs/pleura: There are small bilateral pleural effusions and
overlying atelectasis. There is also patchy areas of right middle
lobe and lingular atelectasis. Mild emphysematous changes are noted.
No infiltrates or pulmonary lesions.

Upper abdomen:  No significant findings.

Review of the MIP images confirms the above findings.
IMPRESSION: 1. Positive for acute PE with CT evidence of right heart strain
(RV/LV Ratio = ) consistent with at least submassive (intermediate
risk) PE. The presence of right heart strain has been associated
with an increased risk of morbidity and mortality. Please activate
Code PE by paging [phone].
2. Normal thoracic aorta except for scattered atherosclerotic
calcifications.
3. Small pleural effusions and overlying atelectasis.
These results were called by telephone at the time of interpretation
on 11/29/2014 at [DATE] to Dr. DILAN BUCHELI , who verbally
acknowledged these results.

## 2017-05-16 ENCOUNTER — Other Ambulatory Visit: Payer: Self-pay | Admitting: Internal Medicine

## 2017-05-16 DIAGNOSIS — Z1231 Encounter for screening mammogram for malignant neoplasm of breast: Secondary | ICD-10-CM

## 2017-06-22 ENCOUNTER — Ambulatory Visit
Admission: RE | Admit: 2017-06-22 | Discharge: 2017-06-22 | Disposition: A | Discharge status: Home / Self Care | Payer: Medicare Other | Source: Ambulatory Visit | Attending: Internal Medicine | Admitting: Internal Medicine

## 2017-06-22 DIAGNOSIS — Z1231 Encounter for screening mammogram for malignant neoplasm of breast: Secondary | ICD-10-CM | POA: Insufficient documentation

## 2018-05-12 ENCOUNTER — Other Ambulatory Visit: Payer: Self-pay | Admitting: Internal Medicine

## 2018-05-12 DIAGNOSIS — Z1231 Encounter for screening mammogram for malignant neoplasm of breast: Secondary | ICD-10-CM

## 2018-06-26 ENCOUNTER — Ambulatory Visit
Admission: RE | Admit: 2018-06-26 | Discharge: 2018-06-26 | Disposition: A | Discharge status: Home / Self Care | Payer: Medicare Other | Source: Ambulatory Visit | Attending: Internal Medicine | Admitting: Internal Medicine

## 2018-06-26 DIAGNOSIS — Z1231 Encounter for screening mammogram for malignant neoplasm of breast: Secondary | ICD-10-CM | POA: Diagnosis not present

## 2018-10-30 DIAGNOSIS — Z808 Family history of malignant neoplasm of other organs or systems: Secondary | ICD-10-CM | POA: Insufficient documentation

## 2018-10-30 DIAGNOSIS — M810 Age-related osteoporosis without current pathological fracture: Secondary | ICD-10-CM | POA: Insufficient documentation

## 2018-10-30 DIAGNOSIS — G5603 Carpal tunnel syndrome, bilateral upper limbs: Secondary | ICD-10-CM | POA: Insufficient documentation

## 2018-10-30 DIAGNOSIS — R55 Syncope and collapse: Secondary | ICD-10-CM | POA: Insufficient documentation

## 2018-10-30 DIAGNOSIS — N6019 Diffuse cystic mastopathy of unspecified breast: Secondary | ICD-10-CM | POA: Insufficient documentation

## 2018-10-30 DIAGNOSIS — Z87828 Personal history of other (healed) physical injury and trauma: Secondary | ICD-10-CM | POA: Insufficient documentation

## 2018-10-30 DIAGNOSIS — R7611 Nonspecific reaction to tuberculin skin test without active tuberculosis: Secondary | ICD-10-CM | POA: Insufficient documentation

## 2018-10-31 ENCOUNTER — Ambulatory Visit (INDEPENDENT_AMBULATORY_CARE_PROVIDER_SITE_OTHER): Payer: Medicare Other

## 2018-10-31 ENCOUNTER — Ambulatory Visit (INDEPENDENT_AMBULATORY_CARE_PROVIDER_SITE_OTHER): Payer: Medicare Other | Admitting: Podiatry

## 2018-10-31 ENCOUNTER — Other Ambulatory Visit: Payer: Self-pay

## 2018-10-31 ENCOUNTER — Encounter: Payer: Self-pay | Admitting: Podiatry

## 2018-10-31 DIAGNOSIS — M722 Plantar fascial fibromatosis: Secondary | ICD-10-CM | POA: Diagnosis not present

## 2018-10-31 MED ORDER — MELOXICAM 15 MG PO TABS: 15.0000 mg | ORAL_TABLET | Freq: Every day | ORAL | 1 refills | Status: DC

## 2018-11-03 NOTE — Progress Notes (Signed)
   Subjective: 84 y.o. female presenting today as a new patient with a chief complaint of pain to the right plantar heel that began about 10 days ago. She states it feels like she is walking on a bruise. She reports intermittent pain that radiates into the Achilles. Walking increases the pain. She has been taking Tylenol and using hemp oil for treatment. Patient is here for further evaluation and treatment.   Past Medical History:  Diagnosis Date  . Arthritis    knees, fingers, thumbs  . Cancer ([place])    skin  . H/O blood clots 2016   bilateral lungs - attributed to Evista use  . Hypertension   . Neurocardiogenic syncope      Objective: Physical Exam General: The patient is alert and oriented x3 in no acute distress.  Dermatology: Skin is warm, dry and supple bilateral lower extremities. Negative for open lesions or macerations bilateral.   Vascular: Dorsalis Pedis and Posterior Tibial pulses palpable bilateral.  Capillary fill time is immediate to all digits.  Neurological: Epicritic and protective threshold intact bilateral.   Musculoskeletal: Tenderness to palpation to the plantar aspect of the right heel along the plantar fascia. All other joints range of motion within normal limits bilateral. Strength 5/5 in all groups bilateral.   Radiographic exam: Normal osseous mineralization. Joint spaces preserved. No fracture/dislocation/boney destruction. No other soft tissue abnormalities or radiopaque foreign bodies.   Assessment: 1. Plantar fasciitis right  Plan of Care:  1. Patient evaluated. Xrays reviewed.   2. Injection of 0.5cc Celestone soluspan injected into the right plantar fascia  3. Rx for Meloxicam ordered for patient. 4. Recommended good shoe gear. Do not recommend going barefoot.  5. Return to clinic in 4 weeks.      Edrick Kins, DPM [facility]  Dr. Edrick Kins, DPM    [address], [place] [address]                Office [phone]  Fax [phone]

## 2018-11-28 ENCOUNTER — Other Ambulatory Visit: Payer: Self-pay

## 2018-11-28 ENCOUNTER — Ambulatory Visit (INDEPENDENT_AMBULATORY_CARE_PROVIDER_SITE_OTHER): Payer: Medicare Other | Admitting: Podiatry

## 2018-11-28 ENCOUNTER — Encounter: Payer: Self-pay | Admitting: Podiatry

## 2018-11-28 VITALS — Temp 97.8°F

## 2018-11-28 DIAGNOSIS — M722 Plantar fascial fibromatosis: Secondary | ICD-10-CM | POA: Diagnosis not present

## 2018-11-28 NOTE — Patient Instructions (Signed)
Vionic sandals

## 2018-12-01 NOTE — Progress Notes (Signed)
   Subjective: 84 y.o. female presenting today for follow up evaluation of plantar fasciitis of the right foot. She states she is doing well. She reports significant relief of the pain after receiving the injections. She has been taking Meloxicam as directed which also helps alleviate the pain. There are no worsening factors noted. Patient is here for further evaluation and treatment.   Past Medical History:  Diagnosis Date  . Arthritis    knees, fingers, thumbs  . Cancer ([place])    skin  . H/O blood clots 2016   bilateral lungs - attributed to Evista use  . Hypertension   . Neurocardiogenic syncope     Objective: Physical Exam General: The patient is alert and oriented x3 in no acute distress.  Dermatology: Skin is cool, dry and supple bilateral lower extremities. Negative for open lesions or macerations.  Vascular: Palpable pedal pulses bilaterally. No edema or erythema noted. Capillary refill within normal limits.  Neurological: Epicritic and protective threshold grossly intact bilaterally.   Musculoskeletal Exam: All pedal and ankle joints range of motion within normal limits bilateral. Muscle strength 5/5 in all groups bilateral.    Assessment: 1. Plantar fasciitis right - resolved   Plan of Care:  1. Patient evaluated.  2. Continue wearing good shoe gear.  3. Continue taking Meloxicam.  4. Recommended Vionic sandals during the summer.  5. Return to clinic as needed.       Edrick Kins, DPM [facility]  Dr. Edrick Kins, DPM    [address], [place] [address]                Office [phone]  Fax [phone]

## 2019-03-05 ENCOUNTER — Other Ambulatory Visit: Payer: Self-pay

## 2019-03-05 DIAGNOSIS — Z20822 Contact with and (suspected) exposure to covid-19: Secondary | ICD-10-CM

## 2019-03-06 LAB — NOVEL CORONAVIRUS, NAA: SARS-CoV-2, NAA: NOT DETECTED

## 2019-05-22 ENCOUNTER — Other Ambulatory Visit: Payer: Self-pay | Admitting: Internal Medicine

## 2019-05-22 DIAGNOSIS — Z1231 Encounter for screening mammogram for malignant neoplasm of breast: Secondary | ICD-10-CM

## 2019-06-27 ENCOUNTER — Ambulatory Visit
Admission: RE | Admit: 2019-06-27 | Discharge: 2019-06-27 | Disposition: A | Discharge status: Home / Self Care | Payer: Medicare PPO | Source: Ambulatory Visit | Attending: Internal Medicine | Admitting: Internal Medicine

## 2019-06-27 DIAGNOSIS — Z1231 Encounter for screening mammogram for malignant neoplasm of breast: Secondary | ICD-10-CM | POA: Insufficient documentation

## 2019-11-09 ENCOUNTER — Emergency Department
Admission: EM | Admit: 2019-11-09 | Discharge: 2019-11-09 | Disposition: A | Discharge status: Home / Self Care | Payer: Medicare PPO | Attending: Student in an Organized Health Care Education/Training Program | Admitting: Student in an Organized Health Care Education/Training Program

## 2019-11-09 ENCOUNTER — Other Ambulatory Visit: Payer: Self-pay

## 2019-11-09 ENCOUNTER — Encounter: Payer: Self-pay | Admitting: Emergency Medicine

## 2019-11-09 ENCOUNTER — Emergency Department: Payer: Medicare PPO

## 2019-11-09 DIAGNOSIS — Z79899 Other long term (current) drug therapy: Secondary | ICD-10-CM | POA: Insufficient documentation

## 2019-11-09 DIAGNOSIS — Z86711 Personal history of pulmonary embolism: Secondary | ICD-10-CM | POA: Diagnosis not present

## 2019-11-09 DIAGNOSIS — Z7982 Long term (current) use of aspirin: Secondary | ICD-10-CM | POA: Insufficient documentation

## 2019-11-09 DIAGNOSIS — I10 Essential (primary) hypertension: Secondary | ICD-10-CM | POA: Insufficient documentation

## 2019-11-09 DIAGNOSIS — R55 Syncope and collapse: Secondary | ICD-10-CM | POA: Diagnosis not present

## 2019-11-09 LAB — COMPREHENSIVE METABOLIC PANEL
ALT: 22 U/L (ref 0–44)
AST: 21 U/L (ref 15–41)
Albumin: 4.2 g/dL (ref 3.5–5.0)
Alkaline Phosphatase: 43 U/L (ref 38–126)
Anion gap: 9 (ref 5–15)
BUN: 18 mg/dL (ref 8–23)
CO2: 29 mmol/L (ref 22–32)
Calcium: 10.4 mg/dL — ABNORMAL HIGH (ref 8.9–10.3)
Chloride: 101 mmol/L (ref 98–111)
Creatinine, Ser: 1.25 mg/dL — ABNORMAL HIGH (ref 0.44–1.00)
GFR calc Af Amer: 46 mL/min — ABNORMAL LOW (ref >60)
GFR calc non Af Amer: 40 mL/min — ABNORMAL LOW (ref >60)
Glucose, Bld: 113 mg/dL — ABNORMAL HIGH (ref 70–99)
Potassium: 4.1 mmol/L (ref 3.5–5.1)
Sodium: 139 mmol/L (ref 135–145)
Total Bilirubin: 1.2 mg/dL (ref 0.3–1.2)
Total Protein: 7.1 g/dL (ref 6.5–8.1)

## 2019-11-09 LAB — CBC
HCT: 43.9 % (ref 36.0–46.0)
Hemoglobin: 14.6 g/dL (ref 12.0–15.0)
MCH: 32.1 pg (ref 26.0–34.0)
MCHC: 33.3 g/dL (ref 30.0–36.0)
MCV: 96.5 fL (ref 80.0–100.0)
Platelets: 282 10*3/uL (ref 150–400)
RBC: 4.55 MIL/uL (ref 3.87–5.11)
RDW: 13.3 % (ref 11.5–15.5)
WBC: 11 10*3/uL — ABNORMAL HIGH (ref 4.0–10.5)
nRBC: 0 % (ref 0.0–0.2)

## 2019-11-09 LAB — URINALYSIS, COMPLETE (UACMP) WITH MICROSCOPIC
Bilirubin Urine: NEGATIVE
Glucose, UA: NEGATIVE mg/dL
Hgb urine dipstick: NEGATIVE
Ketones, ur: NEGATIVE mg/dL
Nitrite: NEGATIVE
Protein, ur: NEGATIVE mg/dL
Specific Gravity, Urine: 1.004 — ABNORMAL LOW (ref 1.005–1.030)
pH: 7 (ref 5.0–8.0)

## 2019-11-09 LAB — TROPONIN I (HIGH SENSITIVITY)
Troponin I (High Sensitivity): 6 ng/L (ref <18)
Troponin I (High Sensitivity): 6 ng/L (ref <18)

## 2019-11-09 MED ORDER — AMLODIPINE BESYLATE 5 MG PO TABS
5.0000 mg | ORAL_TABLET | Freq: Once | ORAL | Status: AC
  Administered 2019-11-09: 5 mg via ORAL
  Filled 2019-11-09: qty 1

## 2019-11-09 MED ORDER — SODIUM CHLORIDE 0.9 % IV BOLUS
500.0000 mL | Freq: Once | INTRAVENOUS | Status: AC
  Administered 2019-11-09: 500 mL via INTRAVENOUS

## 2019-11-09 NOTE — Discharge Instructions (Signed)
Please decrease your metoprolol dose to 12.5mg  BID.  Please follow up with Dr. Doy Hutching.  Be sure to drink plenty of fluids and rest in climate controlled environment.

## 2019-11-09 NOTE — ED Triage Notes (Signed)
Pt was doing yoga and got hot. She went to sit down and had syncope episode.  Pt still feels lightheaded but denies pain or other symptoms.  No weakness. Speech clear.  Does have hx neurocardiogenic syncope, but unsure if this is similar, has not had episode in a while.  HR in 60s at office visits, slightly lower than that today.

## 2019-11-09 NOTE — ED Provider Notes (Signed)
[facility] Emergency Department Provider Note    First MD Initiated Contact with Patient 11/09/19 1202     (approximate)  I have reviewed the triage vital signs and the nursing notes.   HISTORY  Chief Complaint Loss of Consciousness    HPI Cheryl Rosales is a 85 y.o. female with both the past medical history presents to the ER for evaluation of syncopal episode that occurred today while she was doing yoga outside.  Patient states that she started feeling hot and lightheaded.  She went over to sit down in the shade and drink some water ports that she then passed out.  No reported seizure-like activity.  Has a history of neurocardiogenic syncope for which she is on metoprolol.  States she otherwise feels well.  Denies any fevers.  No chest pain or shortness of breath.  No recent medication changes.    Past Medical History:  Diagnosis Date  . Arthritis    knees, fingers, thumbs  . Cancer ([place])    skin  . H/O blood clots 2016   bilateral lungs - attributed to Evista use  . Hypertension   . Neurocardiogenic syncope    Family History  Problem Relation Age of Onset  . Breast cancer Mother 20  . Breast cancer Paternal Aunt   . Breast cancer Maternal Grandmother        great   Past Surgical History:  Procedure Laterality Date  . APPENDECTOMY    . BREAST BIOPSY Left    neg  . CATARACT EXTRACTION W/PHACO Left 02/04/2016   Procedure: CATARACT EXTRACTION PHACO AND INTRAOCULAR LENS PLACEMENT (IOC);  Surgeon: Leandrew Koyanagi, MD;  Location: [facility];  Service: Ophthalmology;  Laterality: Left;  PT WOULD LIKE EARLY AM  . CATARACT EXTRACTION W/PHACO Right 02/25/2016   Procedure: CATARACT EXTRACTION PHACO AND INTRAOCULAR LENS PLACEMENT (IOC);  Surgeon: Leandrew Koyanagi, MD;  Location: [facility];  Service: Ophthalmology;  Laterality: Right;  PT PREFERS EARLY AM  . COLONOSCOPY    . TONSILLECTOMY     Patient Active Problem List    Diagnosis Date Noted  . Carpal tunnel syndrome, bilateral 10/30/2018  . Family history of thyroid cancer 10/30/2018  . Fibrocystic breast disease 10/30/2018  . History of torn meniscus of left knee 10/30/2018  . Neurocardiogenic syncope 10/30/2018  . Osteoporosis, post-menopausal 10/30/2018  . 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/2018  . Hyperlipidemia 08/20/2016  . Pulmonary embolism ([place]) 11/29/2014  . Pulmonary emboli ([place]) 11/29/2014      Prior to Admission medications   Medication Sig Start Date End Date Taking? Authorizing Provider  acetaminophen (ARTHRITIS PAIN) 650 MG CR tablet Take 650 mg by mouth every 8 (eight) hours as needed for pain.    [provider]  aspirin 325 MG tablet Take 325 mg by mouth daily.    [provider]  Biotin 5 MG TABS Take 1 tablet by mouth daily.    [provider]  Calcium Carb-Cholecalciferol (CALCIUM 600 + D PO) Take 1 tablet by mouth 2 (two) times daily.    [provider]  cetirizine (ZYRTEC) 10 MG tablet Take 10 mg by mouth daily as needed for allergies.    [provider]  cholecalciferol (VITAMIN D) 1000 UNITS tablet Take 1,000 Units by mouth daily.    [provider]  Magnesium 500 MG TABS Take by mouth daily.    [provider]  meloxicam (MOBIC) 15 MG tablet Take 1 tablet (15 mg total) by mouth  daily. 10/31/18   Edrick Kins, DPM  metoprolol tartrate (LOPRESSOR) 25 MG tablet Take 0.5 tablets (12.5 mg total) by mouth 2 (two) times daily. Patient taking differently: Take 25 mg by mouth daily.  12/02/14   Idelle Crouch, MD  Multiple Vitamins-Minerals (SENIOR MULTIVITAMIN PLUS) TABS Take 1 tablet by mouth daily.    [provider]  Omega-3 Fatty Acids (FISH OIL) 1200 MG CAPS Take 1 capsule by mouth 2 (two) times daily.    [provider]  vitamin B-12 (CYANOCOBALAMIN) 1000 MCG tablet Take by mouth.    [provider]    Allergies Codeine, Demerol [meperidine], Evista  [raloxifene], Hydrocodone, and Oxycodone    Social History Social History   Tobacco Use  . Smoking status: Never Smoker  . Smokeless tobacco: Never Used  Substance Use Topics  . Alcohol use: No  . Drug use: Not on file    Review of Systems Patient denies headaches, rhinorrhea, blurry vision, numbness, shortness of breath, chest pain, edema, cough, abdominal pain, nausea, vomiting, diarrhea, dysuria, fevers, rashes or hallucinations unless otherwise stated above in HPI. ____________________________________________   PHYSICAL EXAM:  VITAL SIGNS: Vitals:   11/09/19 1527 11/09/19 1600  BP:  (!) 197/88  Pulse:  48  Resp:  12  Temp:    SpO2: 96% 97%    Constitutional: Alert and oriented.  Eyes: Conjunctivae are normal.  Head: Atraumatic. Nose: No congestion/rhinnorhea. Mouth/Throat: Mucous membranes are moist.   Neck: No stridor. Painless ROM.  Cardiovascular: Normal rate, regular rhythm. Grossly normal heart sounds.  Good peripheral circulation. Respiratory: Normal respiratory effort.  No retractions. Lungs CTAB. Gastrointestinal: Soft and nontender. No distention. No abdominal bruits. No CVA tenderness. Genitourinary:  Musculoskeletal: No lower extremity tenderness nor edema.  No joint effusions. Neurologic:  Normal speech and language. No gross focal neurologic deficits are appreciated. No facial droop Skin:  Skin is warm, dry and intact. No rash noted. Psychiatric: Mood and affect are normal. Speech and behavior are normal.  ____________________________________________   LABS (all labs ordered are listed, but only abnormal results are displayed)  Results for orders placed or performed during the hospital encounter of 11/09/19 (from the past 24 hour(s))  CBC     Status: Abnormal   Collection Time: 11/09/19 11:20 AM  Result Value Ref Range   WBC 11.0 (H) 4.0 - 10.5 K/uL   RBC 4.55 3.87 - 5.11 MIL/uL   Hemoglobin 14.6 12.0 - 15.0 g/dL   HCT 43.9 36 - 46 %   MCV  96.5 80.0 - 100.0 fL   MCH 32.1 26.0 - 34.0 pg   MCHC 33.3 30.0 - 36.0 g/dL   RDW 13.3 11.5 - 15.5 %   Platelets 282 150 - 400 K/uL   nRBC 0.0 0.0 - 0.2 %  Urinalysis, Complete w Microscopic     Status: Abnormal   Collection Time: 11/09/19 11:20 AM  Result Value Ref Range   Color, Urine STRAW (A) YELLOW   APPearance HAZY (A) CLEAR   Specific Gravity, Urine 1.004 (L) 1.005 - 1.030   pH 7.0 5.0 - 8.0   Glucose, UA NEGATIVE NEGATIVE mg/dL   Hgb urine dipstick NEGATIVE NEGATIVE   Bilirubin Urine NEGATIVE NEGATIVE   Ketones, ur NEGATIVE NEGATIVE mg/dL   Protein, ur NEGATIVE NEGATIVE mg/dL   Nitrite NEGATIVE NEGATIVE   Leukocytes,Ua MODERATE (A) NEGATIVE   WBC, UA 0-5 0 - 5 WBC/hpf   Bacteria, UA RARE (A) NONE SEEN   Squamous Epithelial / LPF  0-5 0 - 5   Hyaline Casts, UA PRESENT   Comprehensive metabolic panel     Status: Abnormal   Collection Time: 11/09/19 11:20 AM  Result Value Ref Range   Sodium 139 135 - 145 mmol/L   Potassium 4.1 3.5 - 5.1 mmol/L   Chloride 101 98 - 111 mmol/L   CO2 29 22 - 32 mmol/L   Glucose, Bld 113 (H) 70 - 99 mg/dL   BUN 18 8 - 23 mg/dL   Creatinine, Ser 1.25 (H) 0.44 - 1.00 mg/dL   Calcium 10.4 (H) 8.9 - 10.3 mg/dL   Total Protein 7.1 6.5 - 8.1 g/dL   Albumin 4.2 3.5 - 5.0 g/dL   AST 21 15 - 41 U/L   ALT 22 0 - 44 U/L   Alkaline Phosphatase 43 38 - 126 U/L   Total Bilirubin 1.2 0.3 - 1.2 mg/dL   GFR calc non Af Amer 40 (L) >60 mL/min   GFR calc Af Amer 46 (L) >60 mL/min   Anion gap 9 5 - 15  Troponin I (High Sensitivity)     Status: None   Collection Time: 11/09/19 11:20 AM  Result Value Ref Range   Troponin I (High Sensitivity) 6 <18 ng/L  Troponin I (High Sensitivity)     Status: None   Collection Time: 11/09/19  3:05 PM  Result Value Ref Range   Troponin I (High Sensitivity) 6 <18 ng/L   ____________________________________________  EKG My review and personal interpretation at Time: 11:16   Indication: weakness  Rate: 50  Rhythm:  sinus Axis: normal Other: normal intervals, no stemi ____________________________________________  RADIOLOGY  I personally reviewed all radiographic images ordered to evaluate for the above acute complaints and reviewed radiology reports and findings.  These findings were personally discussed with the patient.  Please see medical record for radiology report.  ____________________________________________   PROCEDURES  Procedure(s) performed:  Procedures    Critical Care performed: no ____________________________________________   INITIAL IMPRESSION / ASSESSMENT AND PLAN / ED COURSE  Pertinent labs & imaging results that were available during my care of the patient were reviewed by me and considered in my medical decision making (see chart for details).   DDX: dehydration, dysrhythmia, electrolyte abn, anemia, seizure, acs  JAELYNE DEEG is a 85 y.o. who presents to the ED with syncopal episode as described above.  Seems primarily to be heat related syncope.  She is currently well-appearing mildly hypertensive but denies any chest pain or pressure.  No palpitations.  Mildly bradycardic but is taking high dose of metoprolol.  States that she is very active frequently swimming 3 days a week as well as doing yoga.  Denies any recent illnesses.  Denies any numbness or tingling or headaches.  Does not have any murmurs on exam.  Will give fluid observe in the ER on monitor order serial enzymes to reassess.  She otherwise clinically appears well.     Clinical Course as of Nov 08 1624  Fri Nov 09, 2019  1617 Patient reassessed.  Appears well.  I do suspect some component of heat illness or heat syncope given her history.  She is not having a high blood pressures but her son is now at bedside states that she is anxious to get home and did have a blood pressure of 170 prior to them coming to redraw her troponin and patient does endorse a fear of needles.  I have recommended that she decrease  her metoprolol back to  12.5 twice daily from 25 given her bradycardia.  We will message her PCP regarding further recommendations for blood pressure management.  Have discussed with the patient and available family all diagnostics and treatments performed thus far and all questions were answered to the best of my ability. The patient demonstrates understanding and agreement with plan.    [PR]    Clinical Course User Index [PR] Merlyn Lot, MD    The patient was evaluated in Emergency Department today for the symptoms described in the history of present illness. He/she was evaluated in the context of the global COVID-19 pandemic, which necessitated consideration that the patient might be at risk for infection with the SARS-CoV-2 virus that causes COVID-19. Institutional protocols and algorithms that pertain to the evaluation of patients at risk for COVID-19 are in a state of rapid change based on information released by regulatory bodies including the CDC and federal and state organizations. These policies and algorithms were followed during the patient's care in the ED.  As part of my medical decision making, I reviewed the following data within the [facility] notes reviewed and incorporated, Labs reviewed, notes from prior ED visits and [place] Controlled Substance Database   ____________________________________________   FINAL CLINICAL IMPRESSION(S) / ED DIAGNOSES  Final diagnoses:  Syncope, unspecified syncope type      NEW MEDICATIONS STARTED DURING THIS VISIT:  New Prescriptions   No medications on file     Note:  This document was prepared using Dragon voice recognition software and may include unintentional dictation errors.    Merlyn Lot, MD 11/09/19 1626

## 2020-01-28 ENCOUNTER — Other Ambulatory Visit: Payer: Self-pay | Admitting: Internal Medicine

## 2020-01-28 DIAGNOSIS — R1310 Dysphagia, unspecified: Secondary | ICD-10-CM

## 2020-01-31 ENCOUNTER — Ambulatory Visit
Admission: RE | Admit: 2020-01-31 | Discharge: 2020-01-31 | Disposition: A | Discharge status: Home / Self Care | Payer: Medicare PPO | Source: Ambulatory Visit | Attending: Internal Medicine | Admitting: Internal Medicine

## 2020-01-31 ENCOUNTER — Other Ambulatory Visit: Payer: Self-pay

## 2020-01-31 DIAGNOSIS — R1310 Dysphagia, unspecified: Secondary | ICD-10-CM | POA: Insufficient documentation

## 2020-04-29 ENCOUNTER — Other Ambulatory Visit: Payer: Self-pay | Admitting: Internal Medicine

## 2020-04-29 DIAGNOSIS — Z1231 Encounter for screening mammogram for malignant neoplasm of breast: Secondary | ICD-10-CM

## 2020-06-27 ENCOUNTER — Ambulatory Visit
Admission: RE | Admit: 2020-06-27 | Discharge: 2020-06-27 | Disposition: A | Discharge status: Home / Self Care | Payer: Medicare PPO | Source: Ambulatory Visit | Attending: Internal Medicine | Admitting: Internal Medicine

## 2020-06-27 ENCOUNTER — Other Ambulatory Visit: Payer: Self-pay

## 2020-06-27 DIAGNOSIS — Z1231 Encounter for screening mammogram for malignant neoplasm of breast: Secondary | ICD-10-CM | POA: Insufficient documentation

## 2021-05-20 ENCOUNTER — Other Ambulatory Visit: Payer: Self-pay | Admitting: Internal Medicine

## 2021-05-20 DIAGNOSIS — Z1231 Encounter for screening mammogram for malignant neoplasm of breast: Secondary | ICD-10-CM

## 2021-06-29 ENCOUNTER — Ambulatory Visit
Admission: RE | Admit: 2021-06-29 | Discharge: 2021-06-29 | Disposition: A | Discharge status: Home / Self Care | Payer: Medicare PPO | Source: Ambulatory Visit | Attending: Internal Medicine | Admitting: Internal Medicine

## 2021-06-29 ENCOUNTER — Other Ambulatory Visit: Payer: Self-pay

## 2021-06-29 DIAGNOSIS — Z1231 Encounter for screening mammogram for malignant neoplasm of breast: Secondary | ICD-10-CM | POA: Diagnosis not present

## 2021-11-19 DIAGNOSIS — N1832 Chronic kidney disease, stage 3b: Secondary | ICD-10-CM | POA: Insufficient documentation

## 2021-11-20 ENCOUNTER — Other Ambulatory Visit: Payer: Self-pay | Admitting: Nephrology

## 2021-11-20 DIAGNOSIS — E785 Hyperlipidemia, unspecified: Secondary | ICD-10-CM

## 2021-11-20 DIAGNOSIS — N1832 Chronic kidney disease, stage 3b: Secondary | ICD-10-CM

## 2021-11-27 ENCOUNTER — Ambulatory Visit
Admission: RE | Admit: 2021-11-27 | Discharge: 2021-11-27 | Disposition: A | Discharge status: Home / Self Care | Payer: Medicare PPO | Source: Ambulatory Visit | Attending: Nephrology | Admitting: Nephrology

## 2021-11-27 DIAGNOSIS — E785 Hyperlipidemia, unspecified: Secondary | ICD-10-CM | POA: Diagnosis present

## 2021-11-27 DIAGNOSIS — N1832 Chronic kidney disease, stage 3b: Secondary | ICD-10-CM | POA: Insufficient documentation

## 2022-05-02 ENCOUNTER — Emergency Department
Admission: EM | Admit: 2022-05-02 | Discharge: 2022-05-02 | Disposition: A | Discharge status: Home / Self Care | Payer: Medicare PPO | Attending: Emergency Medicine | Admitting: Emergency Medicine

## 2022-05-02 DIAGNOSIS — R55 Syncope and collapse: Secondary | ICD-10-CM | POA: Diagnosis present

## 2022-05-02 DIAGNOSIS — N189 Chronic kidney disease, unspecified: Secondary | ICD-10-CM | POA: Diagnosis not present

## 2022-05-02 DIAGNOSIS — R109 Unspecified abdominal pain: Secondary | ICD-10-CM | POA: Insufficient documentation

## 2022-05-02 DIAGNOSIS — I129 Hypertensive chronic kidney disease with stage 1 through stage 4 chronic kidney disease, or unspecified chronic kidney disease: Secondary | ICD-10-CM | POA: Insufficient documentation

## 2022-05-02 LAB — CBC WITH DIFFERENTIAL/PLATELET
Abs Immature Granulocytes: 0.05 10*3/uL (ref 0.00–0.07)
Basophils Absolute: 0.1 10*3/uL (ref 0.0–0.1)
Basophils Relative: 1 %
Eosinophils Absolute: 0.7 10*3/uL — ABNORMAL HIGH (ref 0.0–0.5)
Eosinophils Relative: 7 %
HCT: 42.3 % (ref 36.0–46.0)
Hemoglobin: 13.6 g/dL (ref 12.0–15.0)
Immature Granulocytes: 1 %
Lymphocytes Relative: 31 %
Lymphs Abs: 2.9 10*3/uL (ref 0.7–4.0)
MCH: 30.8 pg (ref 26.0–34.0)
MCHC: 32.2 g/dL (ref 30.0–36.0)
MCV: 95.7 fL (ref 80.0–100.0)
Monocytes Absolute: 0.7 10*3/uL (ref 0.1–1.0)
Monocytes Relative: 7 %
Neutro Abs: 5 10*3/uL (ref 1.7–7.7)
Neutrophils Relative %: 53 %
Platelets: 311 10*3/uL (ref 150–400)
RBC: 4.42 MIL/uL (ref 3.87–5.11)
RDW: 13.2 % (ref 11.5–15.5)
WBC: 9.5 10*3/uL (ref 4.0–10.5)
nRBC: 0 % (ref 0.0–0.2)

## 2022-05-02 LAB — URINALYSIS, ROUTINE W REFLEX MICROSCOPIC
Bilirubin Urine: NEGATIVE
Glucose, UA: NEGATIVE mg/dL
Hgb urine dipstick: NEGATIVE
Ketones, ur: NEGATIVE mg/dL
Leukocytes,Ua: NEGATIVE
Nitrite: NEGATIVE
Protein, ur: NEGATIVE mg/dL
Specific Gravity, Urine: 1.004 — ABNORMAL LOW (ref 1.005–1.030)
pH: 7 (ref 5.0–8.0)

## 2022-05-02 LAB — BASIC METABOLIC PANEL
Anion gap: 9 (ref 5–15)
BUN: 22 mg/dL (ref 8–23)
CO2: 24 mmol/L (ref 22–32)
Calcium: 9.3 mg/dL (ref 8.9–10.3)
Chloride: 108 mmol/L (ref 98–111)
Creatinine, Ser: 1.25 mg/dL — ABNORMAL HIGH (ref 0.44–1.00)
GFR, Estimated: 42 mL/min — ABNORMAL LOW (ref >60)
Glucose, Bld: 148 mg/dL — ABNORMAL HIGH (ref 70–99)
Potassium: 4 mmol/L (ref 3.5–5.1)
Sodium: 141 mmol/L (ref 135–145)

## 2022-05-02 LAB — TROPONIN I (HIGH SENSITIVITY)
Troponin I (High Sensitivity): 6 ng/L (ref <18)
Troponin I (High Sensitivity): 6 ng/L (ref <18)

## 2022-05-02 NOTE — Discharge Instructions (Signed)
Return to the ER for new, worsening, or persistent severe weakness or lightheadedness, recurrent episodes of passing out, or any other new or worsening symptoms that concern you.  Follow-up with your regular doctor.

## 2022-05-02 NOTE — ED Triage Notes (Signed)
Pt comes by EMS from [facility] after a syncope episode. Pt was found unresponsive by staff and was still unresponsive when EMS arrived. Pt came to whebn pt was move to EMS stretcher. Pt denies hitting her head. Pt did have another syncope episode w/ EMS on the way to ER. Only lasted for 1 min. Pt is not diabetic.   Pt is A&Ox4 during triage.

## 2022-05-02 NOTE — ED Provider Notes (Signed)
[facility] Provider Note    Event Date/Time   First MD Initiated Contact with Patient 05/02/22 1029     (approximate)   History   Loss of Consciousness   HPI  Cheryl Rosales is a 88 y.o. female with a history of hypertension, hyperlipidemia, chronic kidney disease, arthritis, and neurocardiogenic syncope on metoprolol who presents with an episode of syncope.  The patient states that she was standing up and during go for bread when she lost consciousness and woke up on the ground with EMTs around her.  Per EMS she had a second very brief syncopal episode during transport.  The patient states that she has had this happen multiple times previously, most recently about 6 months ago.  The patient states that she feels fine now.  She states that she was in her usual state of health this morning and did not feel dizzy or lightheaded.  She had no chest pain or difficulty breathing.  She denies any cough, fever, vomiting, or recent illness.  I reviewed the past medical records.  The patient's most recent ED visit was on 11/09/2019 at which time she presented with syncope.  Her workup was negative at that time.  Her most recent outpatient encounter was on 02/01/2022 with Dr. Nehemiah Massed from cardiology for routine follow-up.   Physical Exam   Triage Vital Signs: ED Triage Vitals  Enc Vitals Group     BP --      Pulse Rate 05/02/22 1014 63     Resp 05/02/22 1014 16     Temp 05/02/22 1014 97.6 F (36.4 C)     Temp Source 05/02/22 1014 Temporal     SpO2 05/02/22 1009 99 %     Weight 05/02/22 1015 158 lb (71.7 kg)     Height 05/02/22 1015 '5\' 4"'$  (1.626 m)     Head Circumference --      Peak Flow --      Pain Score 05/02/22 1015 0     Pain Loc --      Pain Edu? --      Excl. in [place]? --     Most recent vital signs: Vitals:   05/02/22 1251 05/02/22 1509  BP:    Pulse: 60 (!) 58  Resp: 15 15  Temp:    SpO2: 100% 98%     General: Alert and oriented,  well-appearing. CV:  Good peripheral perfusion.  Normal heart sounds. Resp:  Normal effort.  Lungs CTAB. Abd:  No distention.  Other:  EOMI.  PERRLA.  Motor intact in all extremities.  Normal coordination.  No ataxia on finger-to-nose.  No pronator drift.   ED Results / Procedures / Treatments   Labs (all labs ordered are listed, but only abnormal results are displayed) Labs Reviewed  BASIC METABOLIC PANEL - Abnormal; Notable for the following components:      Result Value   Glucose, Bld 148 (*)    Creatinine, Ser 1.25 (*)    GFR, Estimated 42 (*)    All other components within normal limits  CBC WITH DIFFERENTIAL/PLATELET - Abnormal; Notable for the following components:   Eosinophils Absolute 0.7 (*)    All other components within normal limits  URINALYSIS, ROUTINE W REFLEX MICROSCOPIC - Abnormal; Notable for the following components:   Color, Urine YELLOW (*)    APPearance HAZY (*)    Specific Gravity, Urine 1.004 (*)    All other components within normal limits  TROPONIN I (HIGH  SENSITIVITY)  TROPONIN I (HIGH SENSITIVITY)     EKG  ED ECG REPORT I, Arta Silence, the attending physician, personally viewed and interpreted this ECG.  Date: 05/02/2022 EKG Time: 1013 Rate: 62 Rhythm: normal sinus rhythm QRS Axis: normal Intervals: normal ST/T Wave abnormalities: normal Narrative Interpretation: no evidence of acute ischemia    RADIOLOGY    PROCEDURES:  Critical Care performed: No  Procedures   MEDICATIONS ORDERED IN ED: Medications - No data to display   IMPRESSION / MDM / [place] / ED COURSE  I reviewed the triage vital signs and the nursing notes.  88 year old female with PMH as noted above presents with his [place] episode with no significant prodrome.  She has no other symptoms currently.  Her vital signs are normal except for hypertension.  Physical exam is unremarkable.  Neurologic exam is nonfocal.  Differential diagnosis  includes, but is not limited to, vasovagal episode, dehydration, electrolyte abnormality, other metabolic cause, cardiac dysrhythmia.  There is no evidence of CNS cause.  The patient has a prior history of PE although this was thought to be secondary to medication and the current presentation is not consistent with acute PE.  We will obtain lab workup including cardiac enzymes, urinalysis, and reassess.  Patient's presentation is most consistent with acute presentation with potential threat to life or bodily function.  The patient is on the cardiac monitor to evaluate for evidence of arrhythmia and/or significant heart rate changes.  ----------------------------------------- 3:16 PM on 05/02/2022 -----------------------------------------  The patient has not had any lightheadedness or other symptoms since she has been in the ED.  Her vital signs are stable.  She remains in sinus rhythm.  Lab workup is overall unremarkable.  Electrolytes are normal.  There is no leukocytosis.  Urinalysis is normal.  Troponins are negative x 2.  I had a discussion with the patient about the plan of care.  I did consider whether she may require admission given the syncope with no clear prodrome.  However, the patient states that she has had this happen multiple times before and has been worked up.  I did offer her admission but she declined; she states that she feels well and would prefer to go home and follow-up with her regular doctor.  Given the negative workup and stable clinical findings in the ED I feel that this is appropriate.  I gave the patient strict return precautions and she expressed understanding.  She will follow-up with her PMD.   FINAL CLINICAL IMPRESSION(S) / ED DIAGNOSES   Final diagnoses:  Syncope, unspecified syncope type     Rx / DC Orders   ED Discharge Orders     None        Note:  This document was prepared using Dragon voice recognition software and may include unintentional  dictation errors.    Arta Silence, MD 05/02/22 1517

## 2022-05-31 ENCOUNTER — Other Ambulatory Visit: Payer: Self-pay | Admitting: Internal Medicine

## 2022-05-31 DIAGNOSIS — Z1231 Encounter for screening mammogram for malignant neoplasm of breast: Secondary | ICD-10-CM

## 2022-07-02 ENCOUNTER — Ambulatory Visit
Admission: RE | Admit: 2022-07-02 | Discharge: 2022-07-02 | Disposition: A | Discharge status: Home / Self Care | Payer: Medicare PPO | Source: Ambulatory Visit | Attending: Internal Medicine | Admitting: Internal Medicine

## 2022-07-02 DIAGNOSIS — Z1231 Encounter for screening mammogram for malignant neoplasm of breast: Secondary | ICD-10-CM | POA: Diagnosis present

## 2022-11-19 ENCOUNTER — Other Ambulatory Visit: Payer: Self-pay | Admitting: Internal Medicine

## 2022-11-19 DIAGNOSIS — R053 Chronic cough: Secondary | ICD-10-CM

## 2022-11-22 ENCOUNTER — Ambulatory Visit
Admission: RE | Admit: 2022-11-22 | Discharge: 2022-11-22 | Disposition: A | Discharge status: Home / Self Care | Payer: Medicare PPO | Source: Ambulatory Visit | Attending: Internal Medicine | Admitting: Internal Medicine

## 2022-11-22 DIAGNOSIS — R053 Chronic cough: Secondary | ICD-10-CM

## 2022-11-22 MED ORDER — IOPAMIDOL (ISOVUE-300) INJECTION 61%
75.0000 mL | Freq: Once | INTRAVENOUS | Status: AC | PRN
  Administered 2022-11-22: 75 mL via INTRAVENOUS

## 2023-08-17 ENCOUNTER — Other Ambulatory Visit: Payer: Self-pay | Admitting: Internal Medicine

## 2023-08-17 DIAGNOSIS — Z1231 Encounter for screening mammogram for malignant neoplasm of breast: Secondary | ICD-10-CM

## 2023-08-26 DIAGNOSIS — R7303 Prediabetes: Secondary | ICD-10-CM | POA: Insufficient documentation

## 2023-08-29 ENCOUNTER — Ambulatory Visit
Admission: RE | Admit: 2023-08-29 | Discharge: 2023-08-29 | Disposition: A | Discharge status: Home / Self Care | Source: Ambulatory Visit | Attending: Internal Medicine | Admitting: Internal Medicine

## 2023-08-29 DIAGNOSIS — Z1231 Encounter for screening mammogram for malignant neoplasm of breast: Secondary | ICD-10-CM | POA: Diagnosis present

## 2023-09-08 ENCOUNTER — Emergency Department

## 2023-09-08 ENCOUNTER — Observation Stay
Admission: EM | Admit: 2023-09-08 | Discharge: 2023-09-10 | Disposition: A | Discharge status: Skilled Nursing Facility | Attending: Hospitalist | Admitting: Hospitalist

## 2023-09-08 ENCOUNTER — Other Ambulatory Visit: Payer: Self-pay

## 2023-09-08 DIAGNOSIS — I69320 Aphasia following cerebral infarction: Principal | ICD-10-CM | POA: Insufficient documentation

## 2023-09-08 DIAGNOSIS — Z7902 Long term (current) use of antithrombotics/antiplatelets: Secondary | ICD-10-CM | POA: Diagnosis not present

## 2023-09-08 DIAGNOSIS — I639 Cerebral infarction, unspecified: Secondary | ICD-10-CM | POA: Diagnosis not present

## 2023-09-08 DIAGNOSIS — N179 Acute kidney failure, unspecified: Secondary | ICD-10-CM | POA: Diagnosis not present

## 2023-09-08 DIAGNOSIS — R4182 Altered mental status, unspecified: Secondary | ICD-10-CM | POA: Diagnosis present

## 2023-09-08 DIAGNOSIS — M1909 Primary osteoarthritis, other specified site: Secondary | ICD-10-CM | POA: Insufficient documentation

## 2023-09-08 DIAGNOSIS — I6389 Other cerebral infarction: Principal | ICD-10-CM | POA: Insufficient documentation

## 2023-09-08 DIAGNOSIS — I129 Hypertensive chronic kidney disease with stage 1 through stage 4 chronic kidney disease, or unspecified chronic kidney disease: Secondary | ICD-10-CM | POA: Insufficient documentation

## 2023-09-08 DIAGNOSIS — Z7982 Long term (current) use of aspirin: Secondary | ICD-10-CM | POA: Diagnosis not present

## 2023-09-08 DIAGNOSIS — I6381 Other cerebral infarction due to occlusion or stenosis of small artery: Secondary | ICD-10-CM | POA: Diagnosis present

## 2023-09-08 DIAGNOSIS — Z85828 Personal history of other malignant neoplasm of skin: Secondary | ICD-10-CM | POA: Insufficient documentation

## 2023-09-08 DIAGNOSIS — R531 Weakness: Principal | ICD-10-CM

## 2023-09-08 DIAGNOSIS — N1832 Chronic kidney disease, stage 3b: Secondary | ICD-10-CM | POA: Insufficient documentation

## 2023-09-08 DIAGNOSIS — R4701 Aphasia: Secondary | ICD-10-CM | POA: Diagnosis present

## 2023-09-08 DIAGNOSIS — D72829 Elevated white blood cell count, unspecified: Secondary | ICD-10-CM | POA: Insufficient documentation

## 2023-09-08 DIAGNOSIS — Z79899 Other long term (current) drug therapy: Secondary | ICD-10-CM | POA: Diagnosis not present

## 2023-09-08 LAB — PROTIME-INR
INR: 1 (ref 0.8–1.2)
Prothrombin Time: 13.8 s (ref 11.4–15.2)

## 2023-09-08 LAB — URINALYSIS, COMPLETE (UACMP) WITH MICROSCOPIC
Bilirubin Urine: NEGATIVE
Glucose, UA: NEGATIVE mg/dL
Hgb urine dipstick: NEGATIVE
Ketones, ur: NEGATIVE mg/dL
Nitrite: NEGATIVE
Protein, ur: 30 mg/dL — AB
Specific Gravity, Urine: 1.019 (ref 1.005–1.030)
pH: 5 (ref 5.0–8.0)

## 2023-09-08 LAB — CBC
HCT: 44.7 % (ref 36.0–46.0)
Hemoglobin: 14.2 g/dL (ref 12.0–15.0)
MCH: 30.1 pg (ref 26.0–34.0)
MCHC: 31.8 g/dL (ref 30.0–36.0)
MCV: 94.7 fL (ref 80.0–100.0)
Platelets: 294 10*3/uL (ref 150–400)
RBC: 4.72 MIL/uL (ref 3.87–5.11)
RDW: 14.3 % (ref 11.5–15.5)
WBC: 15.1 10*3/uL — ABNORMAL HIGH (ref 4.0–10.5)
nRBC: 0 % (ref 0.0–0.2)

## 2023-09-08 LAB — DIFFERENTIAL
Abs Immature Granulocytes: 0.11 10*3/uL — ABNORMAL HIGH (ref 0.00–0.07)
Basophils Absolute: 0 10*3/uL (ref 0.0–0.1)
Basophils Relative: 0 %
Eosinophils Absolute: 0 10*3/uL (ref 0.0–0.5)
Eosinophils Relative: 0 %
Immature Granulocytes: 1 %
Lymphocytes Relative: 8 %
Lymphs Abs: 1.3 10*3/uL (ref 0.7–4.0)
Monocytes Absolute: 1.1 10*3/uL — ABNORMAL HIGH (ref 0.1–1.0)
Monocytes Relative: 7 %
Neutro Abs: 12.7 10*3/uL — ABNORMAL HIGH (ref 1.7–7.7)
Neutrophils Relative %: 84 %

## 2023-09-08 LAB — COMPREHENSIVE METABOLIC PANEL WITH GFR
ALT: 14 U/L (ref 0–44)
AST: 20 U/L (ref 15–41)
Albumin: 4.3 g/dL (ref 3.5–5.0)
Alkaline Phosphatase: 42 U/L (ref 38–126)
Anion gap: 11 (ref 5–15)
BUN: 30 mg/dL — ABNORMAL HIGH (ref 8–23)
CO2: 22 mmol/L (ref 22–32)
Calcium: 9.5 mg/dL (ref 8.9–10.3)
Chloride: 107 mmol/L (ref 98–111)
Creatinine, Ser: 1.69 mg/dL — ABNORMAL HIGH (ref 0.44–1.00)
GFR, Estimated: 29 mL/min — ABNORMAL LOW (ref >60)
Glucose, Bld: 118 mg/dL — ABNORMAL HIGH (ref 70–99)
Potassium: 4.6 mmol/L (ref 3.5–5.1)
Sodium: 140 mmol/L (ref 135–145)
Total Bilirubin: 1.2 mg/dL (ref 0.0–1.2)
Total Protein: 7.2 g/dL (ref 6.5–8.1)

## 2023-09-08 LAB — ETHANOL: Alcohol, Ethyl (B): <15 mg/dL (ref <15)

## 2023-09-08 LAB — APTT: aPTT: 25 s (ref 24–36)

## 2023-09-08 MED ORDER — ASPIRIN 325 MG PO TABS
325.0000 mg | ORAL_TABLET | Freq: Once | ORAL | Status: AC
Start: 2023-09-09 — End: 2023-09-09
  Administered 2023-09-09: 325 mg via ORAL
  Filled 2023-09-08: qty 1

## 2023-09-08 NOTE — ED Triage Notes (Incomplete)
 Pt via [facility] from [facility], pt states she "just don't feel right." See first nurse note. Pt states symptom started last night. Denies pain. Denies any weakness at this time.

## 2023-09-08 NOTE — ED Provider Notes (Addendum)
 [facility] Provider Note    Event Date/Time   First MD Initiated Contact with Patient 09/08/23 1953     (approximate)  History   Chief Complaint: Altered Mental Status  HPI  Cheryl Rosales is a 89 y.o. female with a past medical history of arthritis, hypertension, presents to the emergency department for altered mental state.  Patient is coming from [facility].  Today they noticed some periods of confusion as well as some difficulty speaking.  Staff reported some right sided weakness.  Unknown last known normal.  Currently patient is awake and alert she is able to tell me who she is and where she is.  Patient states she has just not been feeling well.  She cannot really qualify this.  Denies any pain anywhere.  Denies any weakness or numbness anywhere.  Denies any headache.  Patient denies any fever cough congestion vomiting or diarrhea.  Overall the patient appears well.  Physical Exam   Triage Vital Signs: ED Triage Vitals  Encounter Vitals Group     BP 09/08/23 1658 (!) 143/91     Systolic BP Percentile --      Diastolic BP Percentile --      Pulse Rate 09/08/23 1658 71     Resp 09/08/23 1658 20     Temp 09/08/23 1658 97.9 F (36.6 C)     Temp Source 09/08/23 1658 Oral     SpO2 09/08/23 1658 95 %     Weight 09/08/23 1656 158 lb 11.7 oz (72 kg)     Height 09/08/23 1656 5' 4.5" (1.638 m)     Head Circumference --      Peak Flow --      Pain Score 09/08/23 1655 0     Pain Loc --      Pain Education --      Exclude from Growth Chart --     Most recent vital signs: Vitals:   09/08/23 1658 09/08/23 2000  BP: (!) 143/91 (!) 167/95  Pulse: 71 67  Resp: 20 18  Temp: 97.9 F (36.6 C)   SpO2: 95% 100%    General: Awake, no distress.  CV:  Good peripheral perfusion.  Regular rate and rhythm  Resp:  Normal effort.  Equal breath sounds bilaterally.  Abd:  No distention.  Soft, nontender.  No rebound or guarding. Other:  Equal grip strength  bilaterally.  No pronator drift.  No obvious cranial nerve deficit.   ED Results / Procedures / Treatments   EKG  EKG viewed and interpreted by myself shows a normal sinus rhythm at 72 bpm the narrow QRS, normal axis, normal intervals, nonspecific ST changes.  No ST elevation.  RADIOLOGY  I have reviewed interpret the CT head images.  No large bleed seen on my evaluation. Radiology is read the CT scan is negative for acute abnormality.   MEDICATIONS ORDERED IN ED: Medications - No data to display   IMPRESSION / MDM / ASSESSMENT AND PLAN / ED COURSE  I reviewed the triage vital signs and the nursing notes.  Patient's presentation is most consistent with acute presentation with potential threat to life or bodily function.  Patient presents to the emergency department for not feeling well per patient.  However per nursing staff report patient has been experiencing right sided weakness deficits today unknown last known normal.  No deficits on my examination.  Patient's workup today shows a slight leukocytosis of 15,000, reassuring chemistry with mild renal  insufficiency.  Negative alcohol.  We will obtain a urinalysis.  CT scan of the head does not appear to show any significant finding however given the complaints earlier today with right-sided deficits we will proceed with an MRI of the brain to rule out CVA.  Patient and friend agreeable to plan of care.  Urinalysis not appear to show any significant abnormality. MRI is positive for acute left thalamic stroke.  We will admit to the hospital service for further workup and treatment.  Patient is well outside of any window.  Patient's caregiver/friend who is here states she was complaining of not feeling well 2 days ago.  FINAL CLINICAL IMPRESSION(S) / ED DIAGNOSES   Weakness    Note:  This document was prepared using Dragon voice recognition software and may include unintentional dictation errors.   Ruth Cove, MD 09/08/23  2226    Ruth Cove, MD 09/08/23 [phone]

## 2023-09-08 NOTE — ED Notes (Signed)
 Pt states she has had some recent falls x2 last weekend due to weakness where she was unable to get back up on her own. During assessment pt is having a hard time finishing her sentences and struggling to find the right words

## 2023-09-08 NOTE — ED Triage Notes (Signed)
 First nurse note: Pt to ED via [facility] from [facility]. Reports AMS and periods of aphasia and right sided weakness. Per EMS unable to establish LKW. Pt is A&Ox2 with slow responses. Sepsis screen negative.   149/63 86 HR  95% RA CBG 120 97.4 oral 18g RAC

## 2023-09-09 ENCOUNTER — Encounter: Payer: Self-pay | Admitting: Student

## 2023-09-09 ENCOUNTER — Inpatient Hospital Stay
Admit: 2023-09-09 | Discharge: 2023-09-09 | Disposition: A | Discharge status: Home / Self Care | Attending: Student | Admitting: Student

## 2023-09-09 ENCOUNTER — Observation Stay

## 2023-09-09 DIAGNOSIS — I6381 Other cerebral infarction due to occlusion or stenosis of small artery: Secondary | ICD-10-CM | POA: Diagnosis not present

## 2023-09-09 DIAGNOSIS — N179 Acute kidney failure, unspecified: Secondary | ICD-10-CM

## 2023-09-09 DIAGNOSIS — D72829 Elevated white blood cell count, unspecified: Secondary | ICD-10-CM

## 2023-09-09 DIAGNOSIS — I639 Cerebral infarction, unspecified: Secondary | ICD-10-CM

## 2023-09-09 LAB — LIPID PANEL
Cholesterol: 164 mg/dL (ref 0–200)
HDL: 46 mg/dL (ref >40)
LDL Cholesterol: 107 mg/dL — ABNORMAL HIGH (ref 0–99)
Total CHOL/HDL Ratio: 3.6 ratio
Triglycerides: 57 mg/dL (ref <150)
VLDL: 11 mg/dL (ref 0–40)

## 2023-09-09 LAB — CBC
HCT: 37.8 % (ref 36.0–46.0)
Hemoglobin: 12.7 g/dL (ref 12.0–15.0)
MCH: 31.2 pg (ref 26.0–34.0)
MCHC: 33.6 g/dL (ref 30.0–36.0)
MCV: 92.9 fL (ref 80.0–100.0)
Platelets: 255 10*3/uL (ref 150–400)
RBC: 4.07 MIL/uL (ref 3.87–5.11)
RDW: 14.5 % (ref 11.5–15.5)
WBC: 12.7 10*3/uL — ABNORMAL HIGH (ref 4.0–10.5)
nRBC: 0 % (ref 0.0–0.2)

## 2023-09-09 LAB — ECHOCARDIOGRAM COMPLETE
AR max vel: 2.34 cm2
AV Area VTI: 2.37 cm2
AV Area mean vel: 2.31 cm2
AV Mean grad: 3 mmHg
AV Peak grad: 6.3 mmHg
Ao pk vel: 1.25 m/s
Area-P 1/2: 3.37 cm2
Height: 64.5 in
MV VTI: 2.14 cm2
S' Lateral: 3 cm
Weight: 2539.7 [oz_av]

## 2023-09-09 LAB — BASIC METABOLIC PANEL WITH GFR
Anion gap: 7 (ref 5–15)
BUN: 40 mg/dL — ABNORMAL HIGH (ref 8–23)
CO2: 24 mmol/L (ref 22–32)
Calcium: 9 mg/dL (ref 8.9–10.3)
Chloride: 109 mmol/L (ref 98–111)
Creatinine, Ser: 1.53 mg/dL — ABNORMAL HIGH (ref 0.44–1.00)
GFR, Estimated: 33 mL/min — ABNORMAL LOW (ref >60)
Glucose, Bld: 147 mg/dL — ABNORMAL HIGH (ref 70–99)
Potassium: 4 mmol/L (ref 3.5–5.1)
Sodium: 140 mmol/L (ref 135–145)

## 2023-09-09 LAB — HEMOGLOBIN A1C
Hgb A1c MFr Bld: 5.5 % (ref 4.8–5.6)
Mean Plasma Glucose: 111.15 mg/dL

## 2023-09-09 MED ORDER — ATORVASTATIN CALCIUM 20 MG PO TABS
40.0000 mg | ORAL_TABLET | Freq: Every day | ORAL | Status: DC
  Administered 2023-09-09 – 2023-09-10 (×2): 40 mg via ORAL
  Filled 2023-09-09 (×2): qty 2

## 2023-09-09 MED ORDER — STROKE: EARLY STAGES OF RECOVERY BOOK: Freq: Once | Status: AC

## 2023-09-09 MED ORDER — ACETAMINOPHEN 160 MG/5ML PO SOLN: 650.0000 mg | ORAL | Status: DC | PRN

## 2023-09-09 MED ORDER — ACETAMINOPHEN 325 MG PO TABS: 650.0000 mg | ORAL_TABLET | ORAL | Status: DC | PRN

## 2023-09-09 MED ORDER — SODIUM CHLORIDE 0.9 % IV SOLN: INTRAVENOUS | Status: DC

## 2023-09-09 MED ORDER — IOHEXOL 350 MG/ML SOLN
75.0000 mL | Freq: Once | INTRAVENOUS | Status: AC | PRN
  Administered 2023-09-09: 50 mL via INTRAVENOUS

## 2023-09-09 MED ORDER — SENNOSIDES-DOCUSATE SODIUM 8.6-50 MG PO TABS: 1.0000 | ORAL_TABLET | Freq: Every evening | ORAL | Status: DC | PRN

## 2023-09-09 MED ORDER — ENOXAPARIN SODIUM 30 MG/0.3ML IJ SOSY
30.0000 mg | PREFILLED_SYRINGE | INTRAMUSCULAR | Status: DC
  Administered 2023-09-09 – 2023-09-10 (×2): 30 mg via SUBCUTANEOUS
  Filled 2023-09-09 (×2): qty 0.3

## 2023-09-09 MED ORDER — ACETAMINOPHEN 650 MG RE SUPP: 650.0000 mg | RECTAL | Status: DC | PRN

## 2023-09-09 MED ORDER — ASPIRIN 81 MG PO TBEC
81.0000 mg | DELAYED_RELEASE_TABLET | Freq: Every day | ORAL | Status: DC
  Administered 2023-09-09 – 2023-09-10 (×2): 81 mg via ORAL
  Filled 2023-09-09 (×2): qty 1

## 2023-09-09 NOTE — ED Notes (Signed)
 Pt son and boyfriend are visiting her at this time.  TOC RN at the bedside to assist with discharge planning.

## 2023-09-09 NOTE — Evaluation (Addendum)
 Occupational Therapy Evaluation Patient Details Name: Cheryl Rosales MRN: [id number] DOB: 11/17/1934 Today's Date: 09/09/2023   History of Present Illness   89 y/o presented to ED on 09/08/23 from [facility] for AMS and periods of aphasia along with R sided weakness. MRI found acute ischemic L thalamic infarct. PMH: HTN     Clinical Impressions Pt was seen for OT evaluation this date. Prior to hospital admission, pt reports being indep with mobility (using QC PRN) and basic ADL. She endorses driving, indep with med mgt, and takes her meals at dining hall at [facility]. She lives alone in an independent living apartment at [facility] with walk in shower.  Pt presents to acute OT demonstrating impaired ADL performance and functional mobility 2/2 impaired strength globally, balance, and cognition impacting expressive language, problem solving, and safety. (See OT problem list for additional functional deficits). Pt currently requires supv-SBA-CGA for ADL and mobility without AD. VSS throughout. Pt would benefit from skilled OT services to address noted impairments and functional limitations (see below for any additional details) in order to maximize safety and independence while minimizing falls risk and caregiver burden. Anticipate the need for follow up OT services upon acute hospital DC as well as initial increased supports at [facility] to maximize safe transition home. If unable to provide increased assist in home, would benefit from higher level of care with therapy.     If plan is discharge home, recommend the following:   A little help with walking and/or transfers;A little help with bathing/dressing/bathroom;Assistance with cooking/housework;Assist for transportation;Direct supervision/assist for medications management;Supervision due to cognitive status;Direct supervision/assist for financial management;Help with stairs or ramp for entrance     Functional Status Assessment   Patient  has had a recent decline in their functional status and demonstrates the ability to make significant improvements in function in a reasonable and predictable amount of time.     Equipment Recommendations   Tub/shower seat     Recommendations for Other Services         Precautions/Restrictions   Precautions Precautions: Fall Restrictions Weight Bearing Restrictions Per Provider Order: No     Mobility Bed Mobility Overal bed mobility: Modified Independent                  Transfers Overall transfer level: Needs assistance Equipment used: None Transfers: Sit to/from Stand Sit to Stand: Supervision                  Balance Overall balance assessment: Needs assistance Sitting-balance support: Feet unsupported, No upper extremity supported Sitting balance-Leahy Scale: Good     Standing balance support: No upper extremity supported Standing balance-Leahy Scale: Fair                             ADL either performed or assessed with clinical judgement   ADL Overall ADL's : Needs assistance/impaired                                       General ADL Comments: Pt grossly supv level for seated ADL and SBA-CGA for standing ADL     Vision Baseline Vision/History: 0 No visual deficits Ability to See in Adequate Light: 0 Adequate Patient Visual Report: Peripheral vision impairment Vision Assessment?: Vision impaired- to be further tested in functional context;Wears glasses for reading Additional Comments: Pt  endorses blurry vision on R side     Perception         Praxis         Pertinent Vitals/Pain Pain Assessment Pain Assessment: No/denies pain     Extremity/Trunk Assessment Upper Extremity Assessment Upper Extremity Assessment: Generalized weakness (grossly 4/5 bilat, impaired finger to nose with testing bilat)   Lower Extremity Assessment Lower Extremity Assessment: Defer to PT evaluation        Communication Communication Communication: Impaired Factors Affecting Communication: Difficulty expressing self   Cognition Arousal: Alert Behavior During Therapy: WFL for tasks assessed/performed Cognition: Cognition impaired   Orientation impairments: Situation, Time Awareness: Intellectual awareness intact, Online awareness impaired   Attention impairment (select first level of impairment): Selective attention Executive functioning impairment (select all impairments): Problem solving, Reasoning                   Following commands: Impaired Following commands impaired: Follows one step commands with increased time     Cueing  General Comments   Cueing Techniques: Verbal cues      Exercises     Shoulder Instructions      Home Living Family/patient expects to be discharged to:: Other (Comment)                                 Additional Comments: [place] ILF apartment, walk in shower, QC, w/c      Prior Functioning/Environment Prior Level of Function : Driving;History of Falls (last six months);Independent/Modified Independent             Mobility Comments: indep, community mobility, endorses 2 falls in past 1 wk ADLs Comments: indep with basic ADL, doesn't cook, indep with med mgt and was driving    OT Problem List: Decreased coordination;Decreased cognition;Decreased safety awareness;Impaired vision/perception;Other (comment) (impaired expressive language)   OT Treatment/Interventions: Self-care/ADL training;Therapeutic exercise;Therapeutic activities;Cognitive remediation/compensation;Neuromuscular education;DME and/or AE instruction;Patient/family education;Balance training;Visual/perceptual remediation/compensation      OT Goals(Current goals can be found in the care plan section)   Acute Rehab OT Goals Patient Stated Goal: get better and go home OT Goal Formulation: With patient Time For Goal Achievement: 09/23/23 Potential  to Achieve Goals: Good   OT Frequency:  Min 2X/week    Co-evaluation              AM-PAC OT "6 Clicks" Daily Activity     Outcome Measure Help from another person eating meals?: None Help from another person taking care of personal grooming?: None Help from another person toileting, which includes using toliet, bedpan, or urinal?: A Little Help from another person bathing (including washing, rinsing, drying)?: A Little Help from another person to put on and taking off regular upper body clothing?: None Help from another person to put on and taking off regular lower body clothing?: A Little 6 Click Score: 21   End of Session    Activity Tolerance: Patient tolerated treatment well Patient left: in bed;with call bell/phone within reach;with bed alarm set  OT Visit Diagnosis: Unsteadiness on feet (R26.81);Cognitive communication deficit (R41.841) Symptoms and signs involving cognitive functions: Cerebral infarction                Time: 0822-0855 OT Time Calculation (min): 33 min Charges:  OT General Charges $OT Visit: 1 Visit OT Evaluation $OT Eval Moderate Complexity: 1 Mod  Berenda Breaker., MPH, MS, OTR/L ascom [phone] 09/09/23, 9:19 AM

## 2023-09-09 NOTE — Evaluation (Signed)
 Physical Therapy Evaluation Patient Details Name: Cheryl Rosales MRN: [id number] DOB: [birth year] Today's Date: 09/09/2023  History of Present Illness  89 y/o presented to ED on 09/08/23 from [facility] for AMS and periods of aphasia along with R sided weakness. MRI found acute ischemic L thalamic infarct. PMH: HTN  Clinical Impression  Patient admitted with the above. PTA, patient lives alone at [facility] and was independent with mobility, driving, and med management. Patient presents with weakness, impaired balance, decreased activity tolerance, difficulty problem solving, expressive language deficits, and decreased safety awareness. Patient required CGA for ambulation in hallway with no AD due to general unsteadiness and hesitant gait with reduce gait speed. VSS on RA throughout. Patient will benefit from skilled PT services during acute stay to address listed deficits. Patient will benefit from ongoing therapy at discharge to maximize functional independence and safety.       If plan is discharge home, recommend the following: A little help with walking and/or transfers;A little help with bathing/dressing/bathroom;Assistance with cooking/housework;Direct supervision/assist for medications management;Direct supervision/assist for financial management;Supervision due to cognitive status;Assist for transportation   Can travel by private vehicle        Equipment Recommendations None recommended by PT  Recommendations for Other Services       Functional Status Assessment Patient has had a recent decline in their functional status and demonstrates the ability to make significant improvements in function in a reasonable and predictable amount of time.     Precautions / Restrictions Precautions Precautions: Fall Restrictions Weight Bearing Restrictions Per Provider Order: No      Mobility  Bed Mobility Overal bed mobility: Modified Independent                   Transfers Overall transfer level: Needs assistance Equipment used: None Transfers: Sit to/from Stand Sit to Stand: Supervision                Ambulation/Gait Ambulation/Gait assistance: Contact guard assist Gait Distance (Feet): 100 Feet Assistive device: None Gait Pattern/deviations: Step-through pattern, Decreased stride length, Drifts right/left Gait velocity: decreased (hesitant)     General Gait Details: drifting L/R, general unsteadiness noted with patient endorsing slight difference from baseline  Stairs            Wheelchair Mobility     Tilt Bed    Modified Rankin (Stroke Patients Only)       Balance Overall balance assessment: Needs assistance Sitting-balance support: Feet unsupported, No upper extremity supported Sitting balance-Leahy Scale: Good     Standing balance support: No upper extremity supported Standing balance-Leahy Scale: Fair                               Pertinent Vitals/Pain Pain Assessment Pain Assessment: No/denies pain    Home Living Family/patient expects to be discharged to:: Other (Comment)                   Additional Comments: [place] IL[facility] apartment, walk in shower, QC, w/c    Prior Function Prior Level of Function : Driving;History of Falls (last six months);Independent/Modified Independent             Mobility Comments: indep, community mobility, endorses 2 falls in past 1 wk ADLs Comments: indep with basic ADL, doesn't cook, indep with med mgt and was driving     Extremity/Trunk Assessment   Upper Extremity Assessment Upper Extremity Assessment: Defer  to OT evaluation    Lower Extremity Assessment Lower Extremity Assessment: Generalized weakness    Cervical / Trunk Assessment Cervical / Trunk Assessment: Kyphotic  Communication   Communication Communication: Impaired Factors Affecting Communication: Difficulty expressing self    Cognition Arousal: Alert Behavior  During Therapy: WFL for tasks assessed/performed   PT - Cognitive impairments: Awareness, Problem solving, Safety/Judgement                         Following commands: Impaired Following commands impaired: Follows one step commands with increased time     Cueing Cueing Techniques: Verbal cues     General Comments      Exercises     Assessment/Plan    PT Assessment Patient needs continued PT services  PT Problem List Decreased strength;Decreased activity tolerance;Decreased balance;Decreased mobility;Decreased knowledge of use of DME;Decreased cognition;Decreased safety awareness;Decreased knowledge of precautions       PT Treatment Interventions DME instruction;Gait training;Functional mobility training;Therapeutic activities;Therapeutic exercise;Balance training;Neuromuscular re-education;Patient/family education    PT Goals (Current goals can be found in the Care Plan section)  Acute Rehab PT Goals Patient Stated Goal: to go back to her ILF PT Goal Formulation: With patient Time For Goal Achievement: 09/23/23 Potential to Achieve Goals: Good    Frequency Min 3X/week     Co-evaluation               AM-PAC PT "6 Clicks" Mobility  Outcome Measure Help needed turning from your back to your side while in a flat bed without using bedrails?: None Help needed moving from lying on your back to sitting on the side of a flat bed without using bedrails?: None Help needed moving to and from a bed to a chair (including a wheelchair)?: A Little Help needed standing up from a chair using your arms (e.g., wheelchair or bedside chair)?: A Little Help needed to walk in hospital room?: A Little Help needed climbing 3-5 steps with a railing? : A Little 6 Click Score: 20    End of Session   Activity Tolerance: Patient tolerated treatment well Patient left: in bed;with call bell/phone within reach;with bed alarm set Nurse Communication: Mobility status PT Visit  Diagnosis: Muscle weakness (generalized) (M62.81);Unsteadiness on feet (R26.81)    Time: 1610-9604 PT Time Calculation (min) (ACUTE ONLY): 18 min   Charges:   PT Evaluation $PT Eval Moderate Complexity: 1 Mod   PT General Charges $$ ACUTE PT VISIT: 1 Visit         Janine Melbourne, PT, DPT Physical Therapist - [facility]  [facility]   Lear Carstens A Samarra Ridgely 09/09/2023, 10:05 AM

## 2023-09-09 NOTE — ED Notes (Signed)
 Ambulated pt to the bathroom and back to bed. Provided pt with a cup of water.

## 2023-09-09 NOTE — Progress Notes (Signed)
  PROGRESS NOTE    Cheryl Rosales  NFA:[id number] DOB: May 11, 1934 DOA: 09/08/2023 PCP: Yehuda Helms, MD  EDOTFA/EDOTF  LOS: 1 day   Brief hospital course:   Assessment & Plan: Cheryl Rosales is a 89 y.o. female with medical history significant for arthritis and hypertension who presented from [facility] for evaluation of altered mental status and aphasia. Patient reports that 2 days ago, she started not feeling well. Last night, this worsened and she just " did not feel like myself".  She endorsed mild confusion and memory loss. Per friend, patient has some difficulty forming sentences    # Acute left thalamic stroke - Pt presented with 2 days of not feeling like her usual self - Son reported intermittent aphasia, confusion and memory deficit - Mild aphasia and delayed thought process but no focal weakness on exam - MRI brain showing acute infarct of the left thalamus --Echo no thrombus, no atrial level shunt.  LDL 107. Plan: --CTA head/neck --start Lipitor 40 and ASA 81 --PT/OT/SLP --rest per neuro rec   # HTN - BP elevated with SBP in the 140s to 180s --hold BP meds for permissive HTN   # AKI vs CKD --s/p IVF --trend   # Leukocytosis - White count of 15.1, unclear cause - Patient afebrile with no systemic signs of infection, likely reactive --Trend   DVT prophylaxis: Lovenox SQ Code Status: DNR  Family Communication:  Level of care: Med-Surg Dispo:   The patient is from: independent living Anticipated d/c is to: SNF rehab Anticipated d/c date is: whenever insurance auth   Subjective and Interval History:  Pt reported speech improved, able to swallow and ambulate.   Objective: Vitals:   09/09/23 1100 09/09/23 1300 09/09/23 1401 09/09/23 1802  BP: (!) 175/78 (!) 165/79    Pulse: 73 62    Resp: 20 16    Temp:   97.8 F (36.6 C) 98 F (36.7 C)  TempSrc:   Oral Oral  SpO2: 96% 95%    Weight:      Height:       No intake or output data in the  24 hours ending 09/09/23 1813 Filed Weights   09/08/23 1656  Weight: 72 kg    Examination:   Constitutional: NAD, AAOx3, some difficulty finding words and completing sentences HEENT: conjunctivae and lids normal, EOMI CV: No cyanosis.   RESP: normal respiratory effort, on RA Neuro: II - XII grossly intact.   Psych: Normal mood and affect.  Appropriate judgement and reason   Data Reviewed: I have personally reviewed labs and imaging studies  Time spent: 50 minutes  Garrison Kanner, MD [facility] If 7PM-7AM, please contact night-coverage 09/09/2023, 6:13 PM

## 2023-09-09 NOTE — TOC Initial Note (Addendum)
 Transition of Care [facility]) - Initial/Assessment Note    Patient Details  Name: Cheryl Rosales MRN: [id number] Date of Birth: 1935-01-13  Transition of Care [facility]) CM/SW Contact:    Arminda Landmark, RN Phone Number: 09/09/2023, 2:03 PM  Clinical Narrative:                 [facility] consult for SNF placement. Spoke with her and she lives at [facility]. She wants to go to their SNF for PT/OT/ST. Normally she's independent with ADL's and is able to drive. She has a 4 point cane, no other DME. OT note recommends a shower chair. Spoke with Crystal from [facility] and they have a bed for her and requested we start the insurance auth process. Don Fritter is doing the insurance auth. Crystal states they can take her today, tomorrow, but not Sunday. Crystal also states they will get her a shower chair and any other DME she needs when Dc'd from their SNF. Her PCP is Dr. Claudius Cumins at [facility]. Her son will be able to transport her when Dc'd. She has no wounds or open areas. Nitchia is unable to start the auth process. Spoke with Crystal from [facility] and she has started it. She gave her cell # to reach her over the weekend, [phone]. TOC to continue to follow  Expected Discharge Plan: Skilled Nursing Facility Barriers to Discharge: Continued Medical Work up   Patient Goals and CMS Choice Patient states their goals for this hospitalization and ongoing recovery are:: "To go home and be able to drive"          Expected Discharge Plan and Services   Discharge Planning Services: CM Consult Post Acute Care Choice: Durable Medical Equipment (Shower chair recommended by OT) Living arrangements for the past 2 months: Independent Living Facility                                      Prior Living Arrangements/Services Living arrangements for the past 2 months: Independent Living Facility Lives with:: Self Patient language and need for interpreter reviewed:: Yes Do you feel safe going  back to the place where you live?: Yes      Need for Family Participation in Patient Care: Yes (Comment) Care giver support system in place?: No (comment) Current home services: DME (Has a 4 point cane) Criminal Activity/Legal Involvement Pertinent to Current Situation/Hospitalization: No - Comment as needed  Activities of Daily Living   ADL Screening (condition at time of admission) Independently performs ADLs?: Yes (appropriate for developmental age) Is the patient deaf or have difficulty hearing?: No Does the patient have difficulty seeing, even when wearing glasses/contacts?: No Does the patient have difficulty concentrating, remembering, or making decisions?: Yes  Permission Sought/Granted Permission sought to share information with : Family Supports                Emotional Assessment Appearance:: Appears stated age Attitude/Demeanor/Rapport: Gracious Affect (typically observed): Appropriate, Calm, Pleasant Orientation: : Oriented to Self, Oriented to Place, Oriented to  Time, Oriented to Situation Alcohol / Substance Use: Never Used Psych Involvement: No (comment)  Admission diagnosis:  Left thalamic infarction [facility]) [I63.81] Patient Active Problem List   Diagnosis Date Noted   Cerebrovascular accident (CVA) (HCC) 09/09/2023   AKI (acute kidney injury) (HCC) 09/09/2023   Leukocytosis 09/09/2023   Left thalamic infarction (HCC) 09/08/2023   Carpal tunnel syndrome, bilateral  10/30/2018   Family history of thyroid cancer 10/30/2018   Fibrocystic breast disease 10/30/2018   History of torn meniscus of left knee 10/30/2018   Neurocardiogenic syncope 10/30/2018   Osteoporosis, post-menopausal 10/30/2018   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 [place]  [address] Phone: [phone] Fax: [phone]     Social Drivers of Health (SDOH) Social History: SDOH Screenings   Food Insecurity: No Food Insecurity (09/09/2023)  Housing: Low Risk  (09/09/2023)  Transportation Needs: No Transportation Needs (09/09/2023)  Utilities: Not At Risk (09/09/2023)  Financial Resource Strain: Low Risk  (09/07/2023)   Received from [facility]  Social Connections: Socially Isolated (09/09/2023)  Tobacco Use: Low Risk  (09/09/2023)   SDOH Interventions:     Readmission Risk Interventions     No data to display

## 2023-09-09 NOTE — ED Notes (Signed)
 Called and spoke with RN @ [facility] [phone] to advise her that pt is being admitted and that OT felt that she would benefit from some additional help, short term rehab.

## 2023-09-09 NOTE — Evaluation (Signed)
 Speech Language Pathology Evaluation Patient Details Name: Cheryl Rosales MRN: [id number] DOB: 08/09/1934 Today's Date: 09/09/2023 Time: 4098-1191 SLP Time Calculation (min) (ACUTE ONLY): 18 min  Problem List:  Patient Active Problem List   Diagnosis Date Noted   Cerebrovascular accident (CVA) (HCC) 09/09/2023   AKI (acute kidney injury) (HCC) 09/09/2023   Leukocytosis 09/09/2023   Left thalamic infarction (HCC) 09/08/2023   Carpal tunnel syndrome, bilateral 10/30/2018   Family history of thyroid cancer 10/30/2018   Fibrocystic breast disease 10/30/2018   History of torn meniscus of left knee 10/30/2018   Neurocardiogenic syncope 10/30/2018   Osteoporosis, post-menopausal 10/30/2018   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/2018   Hyperlipidemia 08/20/2016   Pulmonary embolism (HCC) 11/29/2014   Pulmonary emboli (HCC) 11/29/2014   Past Medical History:  Past Medical History:  Diagnosis Date   Arthritis    knees, fingers, thumbs   Cancer (HCC)    skin   H/O blood clots 2016   bilateral lungs - attributed to Evista use   Hypertension    Neurocardiogenic syncope    Past Surgical History:  Past Surgical History:  Procedure Laterality Date   APPENDECTOMY     BREAST BIOPSY Left    neg   CATARACT EXTRACTION W/PHACO Left 02/04/2016   Procedure: CATARACT EXTRACTION PHACO AND INTRAOCULAR LENS PLACEMENT (IOC);  Surgeon: Annell Kidney, MD;  Location: [facility];  Service: Ophthalmology;  Laterality: Left;  PT WOULD LIKE EARLY AM   CATARACT EXTRACTION W/PHACO Right 02/25/2016   Procedure: CATARACT EXTRACTION PHACO AND INTRAOCULAR LENS PLACEMENT (IOC);  Surgeon: Annell Kidney, MD;  Location: [facility];  Service: Ophthalmology;  Laterality: Right;  PT PREFERS EARLY AM   COLONOSCOPY     TONSILLECTOMY     HPI:  Cheryl Rosales is a 89 y.o. female with medical history significant for arthritis and hypertension who presented from [facility] for evaluation of altered  mental status and aphasia. Patient reports that 2 days ago, she started not feeling well. Last night, this worsened and she just " did not feel like myself".  She endorsed mild confusion and memory loss. Per friend, patient has some difficulty forming sentences but had no noticeable facial droop. MRI brain shows acute ischemic nonhemorrhagic infarct involving the left thalamus.   Assessment / Plan / Recommendation Clinical Impression  Pt presents with moderate expressive language impairment d/t word finding deficits that results in halting communication with delayed responses observed. Portions of the Western Aphasia Battery were administered. During picture description pt with reduced sentence production as well as simplified syntax that followed the same format "I see a _____." In addition, semantic paraphasias were present which made it difficult for pt to demonstrate recall of information. As a result, she is not able to effectively communicate semi-complex ideas or medical information. Pt is aware of the above deficits. It is difficult to fully assess cognitive abilities as a result. At this time, pt would benefit from short term rehab and increased supervision at discharge. Pt is agreeable to short term rehab at [facility]    SLP Assessment  SLP Recommendation/Assessment: All further Speech Lanaguage Pathology  needs can be addressed in the next venue of care SLP Visit Diagnosis: Apraxia (R48.2)    Recommendations for follow up therapy are one component of a multi-disciplinary discharge planning process, led by the attending physician.  Recommendations may be updated based on patient status, additional functional criteria and insurance authorization.    Follow Up Recommendations  Skilled nursing-short term rehab (<  3 hours/day)    Assistance Recommended at Discharge  Frequent or constant Supervision/Assistance  Functional Status Assessment Patient has had a recent decline in their functional  status and/or demonstrates limited ability to make significant improvements in function in a reasonable and predictable amount of time  Frequency and Duration   TBD        SLP Evaluation Cognition  Overall Cognitive Status: Difficult to assess (d/t language impairment) Arousal/Alertness: Awake/alert Orientation Level: Oriented to person;Oriented to situation       Comprehension  Auditory Comprehension Overall Auditory Comprehension: Appears within functional limits for tasks assessed Yes/No Questions: Within Functional Limits Commands: Within Functional Limits    Expression Expression Primary Mode of Expression: Verbal Verbal Expression Overall Verbal Expression: Impaired Initiation: Impaired Automatic Speech: Name;Social Response Level of Generative/Spontaneous Verbalization: Sentence Repetition: No impairment Naming: Impairment Responsive: 51-75% accurate Confrontation: Impaired Convergent: 50-74% accurate Divergent: 50-74% accurate Verbal Errors:  (hatling, delayed response to allow for word finding) Pragmatics: No impairment Non-Verbal Means of Communication: Not applicable   Oral / Motor  Oral Motor/Sensory Function Overall Oral Motor/Sensory Function: Within functional limits Motor Speech Overall Motor Speech: Appears within functional limits for tasks assessed Respiration: Within functional limits Phonation: Normal Resonance: Within functional limits Articulation: Within functional limitis Intelligibility: Intelligible Motor Planning: Witnin functional limits Motor Speech Errors: Not applicable           Cheryl Rosales, M.S., CCC-SLP, Tree surgeon Certified Brain Injury Specialist [facility]  [facility] Office [phone] Ascom [phone] Fax [phone]

## 2023-09-09 NOTE — H&P (Signed)
 History and Physical  Cheryl Rosales JOA:[id number] DOB: 15-Mar-1935 DOA: 09/08/2023  PCP: Yehuda Helms, MD   Chief Complaint: Altered mental status  HPI: Cheryl Rosales is a 89 y.o. female with medical history significant for arthritis and hypertension who presented from [facility] for evaluation of altered mental status and aphasia. Patient reports that 2 days ago, she started not feeling well. Last night, this worsened and she just " did not feel like myself".  She endorsed mild confusion and memory loss. Per friend, patient has some difficulty forming sentences but had no noticeable facial droop.  She denies any focal weakness, numbness, tingling, chest pain, shortness of breath, palpitations, vision changes, fever, chills, abdominal pain, nausea, vomiting, dysuria, headache or recent falls.  ED Course: Initial vitals shows patient afebrile, hypertensive with SBP in the 140s to 180s. Labs show WBC 15.1, creatinine 1.69, negative ethanol levels and UA with no signs of infection. EKG shows normal sinus with nonspecific T wave abnormality. CT head with no acute intracranial abnormality. MRI brain shows acute ischemic nonhemorrhagic infarct involving the left thalamus. TRH was consulted for admission.  Review of Systems: Please see HPI for pertinent positives and negatives. A complete 10 system review of systems are otherwise negative.  Past Medical History:  Diagnosis Date   Arthritis    knees, fingers, thumbs   Cancer (HCC)    skin   H/O blood clots 2016   bilateral lungs - attributed to Evista use   Hypertension    Neurocardiogenic syncope    Past Surgical History:  Procedure Laterality Date   APPENDECTOMY     BREAST BIOPSY Left    neg   CATARACT EXTRACTION W/PHACO Left 02/04/2016   Procedure: CATARACT EXTRACTION PHACO AND INTRAOCULAR LENS PLACEMENT (IOC);  Surgeon: Annell Kidney, MD;  Location: [facility];  Service: Ophthalmology;  Laterality: Left;  PT  WOULD LIKE EARLY AM   CATARACT EXTRACTION W/PHACO Right 02/25/2016   Procedure: CATARACT EXTRACTION PHACO AND INTRAOCULAR LENS PLACEMENT (IOC);  Surgeon: Annell Kidney, MD;  Location: [facility];  Service: Ophthalmology;  Laterality: Right;  PT PREFERS EARLY AM   COLONOSCOPY     TONSILLECTOMY     Social History:  reports that she has never smoked. She has never used smokeless tobacco. She reports that she does not drink alcohol. No history on file for drug use.  Allergies  Allergen Reactions   Codeine Swelling   Demerol [Meperidine] Swelling   Evista [Raloxifene] Other (See Comments)    Pt reports MD told her possibly gave her blood clots   Hydrocodone Nausea And Vomiting   Oxycodone Nausea And Vomiting    Family History  Problem Relation Age of Onset   Breast cancer Mother 85   Breast cancer Paternal Aunt    Breast cancer Maternal Grandmother        great     Prior to Admission medications   Medication Sig Start Date End Date Taking? Authorizing Provider  acetaminophen  (ARTHRITIS PAIN) 650 MG CR tablet Take 650 mg by mouth every 8 (eight) hours as needed for pain.    [provider]  aspirin 325 MG tablet Take 325 mg by mouth daily.    [provider]  Biotin 5 MG TABS Take 1 tablet by mouth daily.    [provider]  Calcium  Carb-Cholecalciferol (CALCIUM  600 + D PO) Take 1 tablet by mouth 2 (two) times daily.    [provider]  cetirizine (ZYRTEC) 10 MG  tablet Take 10 mg by mouth daily as needed for allergies.    [provider]  cholecalciferol (VITAMIN D ) 1000 UNITS tablet Take 1,000 Units by mouth daily.    [provider]  Magnesium 500 MG TABS Take by mouth daily.    [provider]  meloxicam  (MOBIC ) 15 MG tablet Take 1 tablet (15 mg total) by mouth daily. 10/31/18   Dot Gazella, DPM  metoprolol  tartrate (LOPRESSOR ) 25 MG tablet Take 0.5 tablets (12.5 mg total) by mouth 2 (two) times  daily. Patient taking differently: Take 25 mg by mouth daily.  12/02/14   Yehuda Helms, MD  Multiple Vitamins-Minerals (SENIOR MULTIVITAMIN PLUS) TABS Take 1 tablet by mouth daily.    [provider]  Omega-3 Fatty Acids (FISH OIL) 1200 MG CAPS Take 1 capsule by mouth 2 (two) times daily.    [provider]  vitamin B-12 (CYANOCOBALAMIN) 1000 MCG tablet Take by mouth.    [provider]    Physical Exam: BP (!) 167/95   Pulse 67   Temp 97.9 F (36.6 C) (Oral)   Resp 18   Ht 5' 4.5" (1.638 m)   Wt 72 kg   SpO2 100%   BMI 26.83 kg/m  General: Pleasant, well-appearing elderly woman laying in bed. No acute distress. HEENT: [place]/AT. Anicteric sclera. EOMI. PERRLA CV: RRR. No murmurs, rubs, or gallops. No LE edema Pulmonary: Lungs CTAB. Normal effort. No wheezing or rales. Abdominal: Soft, nontender, nondistended. Normal bowel sounds. Extremities: Palpable radial and DP pulses. Normal ROM. Skin: Warm and dry. No obvious rash or lesions. Neuro: A&Ox3. Speech is clear and comprehensible. Mild aphasia with delayed thought process. No facial asymmetry. Moves all extremities. Normal finger-nose testing, but very minimal shaking of the right hand. Strength 5/5 in all extremities. Normal sensation to light touch. Psych: Normal mood and affect          Labs on Admission:  Basic Metabolic Panel: Recent Labs  Lab 09/08/23 1702  NA 140  K 4.6  CL 107  CO2 22  GLUCOSE 118*  BUN 30*  CREATININE 1.69*  CALCIUM  9.5   Liver Function Tests: Recent Labs  Lab 09/08/23 1702  AST 20  ALT 14  ALKPHOS 42  BILITOT 1.2  PROT 7.2  ALBUMIN 4.3   No results for input(s): "LIPASE", "AMYLASE" in the last 168 hours. No results for input(s): "AMMONIA" in the last 168 hours. CBC: Recent Labs  Lab 09/08/23 1702  WBC 15.1*  NEUTROABS 12.7*  HGB 14.2  HCT 44.7  MCV 94.7  PLT 294   Cardiac Enzymes: No results for input(s): "CKTOTAL", "CKMB", "CKMBINDEX",  "TROPONINI" in the last 168 hours. BNP (last 3 results) No results for input(s): "BNP" in the last 8760 hours.  ProBNP (last 3 results) No results for input(s): "PROBNP" in the last 8760 hours.  CBG: No results for input(s): "GLUCAP" in the last 168 hours.  Radiological Exams on Admission: MR BRAIN WO CONTRAST Result Date: 09/08/2023 CLINICAL DATA:  Initial evaluation for acute mental status change, unknown cause. EXAM: MRI HEAD WITHOUT CONTRAST TECHNIQUE: Multiplanar, multiecho pulse sequences of the brain and surrounding structures were obtained without intravenous contrast. COMPARISON:  CT from earlier the same day. FINDINGS: Brain: Cerebral volume within normal limits for age. Patchy T2/FLAIR hyperintensity involving the supratentorial cerebral white matter, consistent with chronic small vessel ischemic disease, moderately advanced in nature. Restricted diffusion involving the left thalamus with extension towards the mesial left temporal lobe, consistent with  an acute ischemic infarct (series 5, image 31). No associated hemorrhage or significant mass effect. No other evidence for acute or subacute ischemia. No acute intracranial hemorrhage. Multiple scattered chronic micro hemorrhages noted, likely hypertensive in nature. No mass lesion, midline shift or mass effect. No hydrocephalus or extra-axial fluid collection. Pituitary gland within normal limits. Vascular: Major intracranial vascular flow voids are maintained. Skull and upper cervical spine: Craniocervical junction within normal limits. Bone marrow signal intensity normal. No scalp soft tissue abnormality. Sinuses/Orbits: Prior bilateral ocular lens replacement. Paranasal sinuses are largely clear. No significant mastoid effusion. Other: None. IMPRESSION: 1. Acute ischemic nonhemorrhagic infarct involving the left thalamus as above. 2. Underlying moderately advanced chronic microvascular ischemic disease. Electronically Signed   By: Virgia Griffins M.D.   On: 09/08/2023 23:05   CT HEAD WO CONTRAST Result Date: 09/08/2023 CLINICAL DATA:  Aphasia and right-sided weakness EXAM: CT HEAD WITHOUT CONTRAST TECHNIQUE: Contiguous axial images were obtained from the base of the skull through the vertex without intravenous contrast. RADIATION DOSE REDUCTION: This exam was performed according to the departmental dose-optimization program which includes automated exposure control, adjustment of the mA and/or kV according to patient size and/or use of iterative reconstruction technique. COMPARISON:  08/26/2016 FINDINGS: Brain: No evidence of acute infarction, hemorrhage, hydrocephalus, extra-axial collection or mass lesion/mass effect. Mild atrophic changes and chronic white matter ischemic changes are noted. Vascular: No hyperdense vessel or unexpected calcification. Skull: Normal. Negative for fracture or focal lesion. Sinuses/Orbits: No acute finding. Other: None. IMPRESSION: Chronic atrophic and ischemic changes without acute abnormality. Electronically Signed   By: Violeta Grey M.D.   On: 09/08/2023 19:19   Assessment/Plan Cheryl Rosales is a 89 y.o. female with medical history significant for arthritis and hypertension who presented from [facility] for evaluation of altered mental status and aphasia and found to have acute left thalamic stroke.  # Acute left thalamic stroke - Pt presented with 2 days of not feeling like her usual self - Son reported intermittent aphasia, confusion and memory deficit - Mild aphasia and delayed thought process but no focal weakness on exam - MRI brain showing acute infarct of the left thalamus - Admit to progressive bed for close neuromonitoring - Neurology consulted, appreciate recs - Aspirin 325 mg x 1 - Follow-up TTE - Check lipid panel, A1c - PT/OT/SLP eval and treat - Frequent neurochecks  # HTN - BP elevated with SBP in the 140s to 180s - Permissive hypertension in the setting of acute  stroke - Long-term BP goal of <130/80  # AKI - Creatinine of 1.69, unclear baseline but last creatinine 1.25 in July 2024 - IV NS at 100 cc/h for 10 hours - Avoid nephrotoxic agents - Trend renal function  # Leukocytosis - White count of 15.1, unclear cause - Patient afebrile with no systemic signs of infection, likely reactive - Trend CBC  DVT prophylaxis: Lovenox     Code Status: Limited: Do not attempt resuscitation (DNR) -DNR-LIMITED -Do Not Intubate/DNI   Consults called: Neurology  Family Communication: Discussed admission with friend at bedside  Severity of Illness: The appropriate patient status for this patient is INPATIENT. Inpatient status is judged to be reasonable and necessary in order to provide the required intensity of service to ensure the patient's safety. The patient's presenting symptoms, physical exam findings, and initial radiographic and laboratory data in the context of their chronic comorbidities is felt to place them at high risk for further clinical deterioration. Furthermore, it is not  anticipated that the patient will be medically stable for discharge from the hospital within 2 midnights of admission.   * I certify that at the point of admission it is my clinical judgment that the patient will require inpatient hospital care spanning beyond 2 midnights from the point of admission due to high intensity of service, high risk for further deterioration and high frequency of surveillance required.*  Level of care: Progressive   This record has been created using Conservation officer, historic buildings. Errors have been sought and corrected, but may not always be located. Such creation errors do not reflect on the standard of care.   Vita Grip, MD 09/09/2023, 12:04 AM [facility] Pager: [phone] Isaiah 41:10   If 7PM-7AM, please contact night-coverage [email or web address] Password [email or web address]

## 2023-09-09 NOTE — ED Notes (Signed)
 Attempted to contact the patient's son to inform him the patient was moving to another room. Call went to voicemail. Did not leave voicemail.

## 2023-09-09 NOTE — ED Notes (Signed)
 Pt has ready bed, per floor we are holding pt here at this time to see if she might be d/c

## 2023-09-09 NOTE — ED Notes (Signed)
 Call to cafeteria for breakfast tray

## 2023-09-09 NOTE — Care Management CC44 (Signed)
 Condition Code 44 Documentation Completed  Patient Details  Name: Cheryl Rosales MRN: [id number] Date of Birth: Sep 03, 1934   Condition Code 44 given:  Yes Patient signature on Condition Code 44 notice:  Yes Documentation of 2 MD's agreement:  Yes Code 44 added to claim:  Yes    Nellene Banana Demontrez Rindfleisch, RN 09/09/2023, 4:09 PM

## 2023-09-09 NOTE — Progress Notes (Signed)
*  PRELIMINARY RESULTS* Echocardiogram 2D Echocardiogram has been performed.  Cheryl Rosales 09/09/2023, 1:13 PM

## 2023-09-09 NOTE — ED Notes (Signed)
 Pt ambulated to the bathroom to void with stand by assist.  No difficulties.

## 2023-09-10 DIAGNOSIS — I6381 Other cerebral infarction due to occlusion or stenosis of small artery: Secondary | ICD-10-CM | POA: Diagnosis not present

## 2023-09-10 MED ORDER — ATORVASTATIN CALCIUM 40 MG PO TABS: 40.0000 mg | ORAL_TABLET | Freq: Every day | ORAL | Status: AC

## 2023-09-10 MED ORDER — CLOPIDOGREL BISULFATE 75 MG PO TABS: 75.0000 mg | ORAL_TABLET | Freq: Every day | ORAL | Status: AC

## 2023-09-10 MED ORDER — ASPIRIN 81 MG PO TBEC: 81.0000 mg | DELAYED_RELEASE_TABLET | Freq: Every day | ORAL | Status: AC

## 2023-09-10 MED ORDER — FESOTERODINE FUMARATE ER 4 MG PO TB24
4.0000 mg | ORAL_TABLET | Freq: Every day | ORAL | Status: DC
  Administered 2023-09-10: 4 mg via ORAL
  Filled 2023-09-10 (×2): qty 1

## 2023-09-10 MED ORDER — CLOPIDOGREL BISULFATE 75 MG PO TABS
75.0000 mg | ORAL_TABLET | Freq: Every day | ORAL | Status: DC
  Administered 2023-09-10: 75 mg via ORAL
  Filled 2023-09-10: qty 1

## 2023-09-10 MED ORDER — MONTELUKAST SODIUM 10 MG PO TABS
10.0000 mg | ORAL_TABLET | Freq: Every day | ORAL | Status: DC
  Administered 2023-09-10: 10 mg via ORAL
  Filled 2023-09-10: qty 1

## 2023-09-10 NOTE — Discharge Summary (Signed)
 Physician Discharge Summary   Cheryl Rosales  female DOB: 10-06-1934  UEA:[id number]  PCP: Yehuda Helms, MD  Admit date: 09/08/2023 Discharge date: 09/10/2023  Admitted From: home Disposition:  SNF rehab CODE STATUS: DNR  Discharge Instructions     Ambulatory referral to Neurology   Complete by: As directed    Diet - low sodium heart healthy   Complete by: As directed       Hospital Course:  For full details, please see H&P, progress notes, consult notes and ancillary notes.  Briefly,  Cheryl Rosales is a 89 y.o. female with medical history significant for arthritis and hypertension who presented from [facility] for evaluation of altered mental status and aphasia.   Patient reports that 2 days prior, she started not feeling well.  She endorsed mild confusion and memory loss.  Per friend, patient has some difficulty forming sentences    # Acute left thalamic stroke - Pt presented with 2 days of not feeling like her usual self.  Son reported intermittent aphasia, confusion and memory deficit.  Mild aphasia and delayed thought process but no focal weakness on exam. - MRI brain showing acute infarct of the left thalamus.  CTA head/neck no large vessel occlusion.  Echo no thrombus, no atrial level shunt.  LDL 107. --started on Lipitor 40 mg daily - ASA 81mg  daily + plavix 75mg  daily x21 days f/b ASA 81mg  daily monotherapy after that  --neuro to arrange outpatient neuro f/u.  # HTN - BP elevated with SBP in the 140s to 180s --BP meds held for permissive HTN --resume home losartan after discharge. --Pt not taking Lopressor  PTA.   # CKD 3b AKI ruled out  # Leukocytosis - White count of 15.1, unclear cause - Patient afebrile with no systemic signs of infection, likely reactive.  Trended down without abx.   Discharge Diagnoses:  Principal Problem:   Left thalamic infarction [facility]) Active Problems:   Cerebrovascular accident (CVA) (HCC)   AKI (acute kidney  injury) (HCC)   Leukocytosis   Stroke (HCC)   30 Day Unplanned Readmission Risk Score    Flowsheet Row ED to Hosp-Admission (Current) from 09/08/2023 in [facility] 1C MEDICAL TELEMETRY  30 Day Unplanned Readmission Risk Score (%) 10.19 Filed at 09/09/2023 1200       This score is the patient's risk of an unplanned readmission within 30 days of being discharged (0 -100%). The score is based on dignosis, age, lab data, medications, orders, and past utilization.   Low:  0-14.9   Medium: 15-21.9   High: 22-29.9   Extreme: 30 and above         Discharge Instructions:  Allergies as of 09/10/2023       Reactions   Codeine Swelling   Demerol [meperidine] Swelling   Evista [raloxifene] Other (See Comments)   Pt reports MD told her possibly gave her blood clots   Hydrocodone Nausea And Vomiting   Oxycodone Nausea And Vomiting        Medication List     STOP taking these medications    azithromycin 250 MG tablet Commonly known as: ZITHROMAX   meloxicam  15 MG tablet Commonly known as: MOBIC    metoprolol  tartrate 25 MG tablet Commonly known as: LOPRESSOR        TAKE these medications    Arthritis Pain 650 MG CR tablet Generic drug: acetaminophen  Take 650 mg by mouth every 8 (eight) hours as needed for pain.   aspirin  EC 81 MG tablet Take 1 tablet (81 mg total) by mouth daily. Swallow whole. Start taking on: Sep 11, 2023   atorvastatin 40 MG tablet Commonly known as: LIPITOR Take 1 tablet (40 mg total) by mouth daily. Start taking on: Sep 11, 2023   cetirizine 10 MG tablet Commonly known as: ZYRTEC Take 10 mg by mouth daily as needed for allergies.   clopidogrel 75 MG tablet Commonly known as: PLAVIX Take 1 tablet (75 mg total) by mouth daily for 21 days.   losartan 100 MG tablet Commonly known as: COZAAR Take 100 mg by mouth daily.   montelukast 10 MG tablet Commonly known as: SINGULAIR Take 10 mg by mouth daily.   solifenacin  10 MG tablet Commonly known as: VESICARE Take 10 mg by mouth daily.         Follow-up Information     Yehuda Helms, MD Follow up in 1 week(s).   Specialty: Internal Medicine Contact information: [address] [place] [address] [phone]                 Allergies  Allergen Reactions   Codeine Swelling   Demerol [Meperidine] Swelling   Evista [Raloxifene] Other (See Comments)    Pt reports MD told her possibly gave her blood clots   Hydrocodone Nausea And Vomiting   Oxycodone Nausea And Vomiting     The results of significant diagnostics from this hospitalization (including imaging, microbiology, ancillary and laboratory) are listed below for reference.   Consultations:   Procedures/Studies: CT ANGIO HEAD NECK W WO CM Result Date: 09/09/2023 CLINICAL DATA:  Neuro deficit, acute, stroke suspected EXAM: CT ANGIOGRAPHY HEAD AND NECK WITH AND WITHOUT CONTRAST TECHNIQUE: Multidetector CT imaging of the head and neck was performed using the standard protocol during bolus administration of intravenous contrast. Multiplanar CT image reconstructions and MIPs were obtained to evaluate the vascular anatomy. Carotid stenosis measurements (when applicable) are obtained utilizing NASCET criteria, using the distal internal carotid diameter as the denominator. RADIATION DOSE REDUCTION: This exam was performed according to the departmental dose-optimization program which includes automated exposure control, adjustment of the mA and/or kV according to patient size and/or use of iterative reconstruction technique. CONTRAST:  50mL OMNIPAQUE  IOHEXOL  350 MG/ML SOLN COMPARISON:  CT head May 22, 25. FINDINGS: CT HEAD FINDINGS Brain: No evidence of acute infarction, hemorrhage, hydrocephalus, extra-axial collection or mass lesion/mass effect. Remote right basal ganglia lacunar infarct. Patchy white matter hypodensities, compatible with chronic microvascular ischemic disease.  Vascular: See below. Skull: No acute fracture. Sinuses/Orbits: Clear sinuses.  No acute orbital findings. Other: No mastoid effusions. Review of the MIP images confirms the above findings CTA NECK FINDINGS Aortic arch: Great vessel origins appear patent. Right carotid system: Atherosclerosis at the carotid bifurcation without greater than 50% stenosis. Left carotid system: Ulcerated atherosclerosis at the carotid bifurcation without greater than 50% stenosis. Approximately outpouching along the medial aspect of the carotid bifurcation which includes the origin of the EAC, compatible with aneurysm. Vertebral arteries: Codominant. No evidence of dissection, stenosis (50% or greater), or occlusion. Skeleton: No acute fracture on limited assessment. Other neck: No acute abnormality on limited assessment. Upper chest: Visualized lung apices are clear. Review of the MIP images confirms the above findings CTA HEAD FINDINGS Anterior circulation: Bilateral intracranial ICAs,, MCAs, and ACAs are patent without proximal hemodynamically significant stenosis. Approximately 3 mm anteriorly directed outpouching rising from the left carotid terminus, compatible with aneurysm (series 11, image 104). Posterior circulation: Bilateral intradural  vertebral arteries, basilar artery and bilateral post cerebral arteries are patent. Moderate left distal P2 PCA stenosis. Right fetal type PCA, anatomic variant. Venous sinuses: As permitted by contrast timing, patent. Review of the MIP images confirms the above findings IMPRESSION: 1. No large vessel occlusion. 2. Moderate left distal P2 PCA stenosis. 3. 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 4. Ulcerated atherosclerosis at the left carotid bifurcation with approximately 3 mm aneurysm at the ECA origin. Electronically Signed   By: Stevenson Elbe M.D.   On: 09/09/2023 19:12   ECHOCARDIOGRAM COMPLETE Result Date: 09/09/2023    ECHOCARDIOGRAM REPORT   Patient Name:   KENNADEE WALTHOUR Date of Exam: 09/09/2023 Medical Rec #:  [id number]         Height:       64.5 in Accession #:    [id number]        Weight:       158.7 lb Date of Birth:  1935-01-13         BSA:          1.783 m Patient Age:    89 years          BP:           166/120 mmHg Patient Gender: F                 HR:           66 bpm. Exam Location:  [facility] Procedure: 2D Echo, Cardiac Doppler and Color Doppler (Both Spectral and Color            Flow Doppler were utilized during procedure). Indications:     Stroke I63.9  History:         Patient has prior history of Echocardiogram examinations. Risk                  Factors:Hypertension.  Sonographer:     Kathaleen Pale Roar Referring Phys:  [id number] Annette Killings AMPONSAH Diagnosing Phys: Antonette Batters MD IMPRESSIONS  1. Left ventricular ejection fraction, by estimation, is 55 to 60%. The left ventricle has normal function. The left ventricle has no regional wall motion abnormalities. Left ventricular diastolic parameters are consistent with Grade I diastolic dysfunction (impaired relaxation).  2. Right ventricular systolic function is normal. The right ventricular size is normal.  3. Left atrial size was mildly dilated.  4. Right atrial size was mildly dilated.  5. The mitral valve is normal in structure. Trivial mitral valve regurgitation.  6. The aortic valve is normal in structure. Aortic valve regurgitation is not visualized. FINDINGS  Left Ventricle: Left ventricular ejection fraction, by estimation, is 55 to 60%. The left ventricle has normal function. The left ventricle has no regional wall motion abnormalities. Strain was performed and the global longitudinal strain is indeterminate. Global longitudinal strain performed but not reported based on interpreter judgement due to suboptimal tracking. The left ventricular internal cavity size was normal in size. There is borderline concentric left ventricular hypertrophy. Left ventricular diastolic parameters are consistent with Grade I  diastolic dysfunction (impaired relaxation). Right Ventricle: The right ventricular size is normal. No increase in right ventricular wall thickness. Right ventricular systolic function is normal. Left Atrium: Left atrial size was mildly dilated. Right Atrium: Right atrial size was mildly dilated. Pericardium: There is no evidence of pericardial effusion. Mitral Valve: The mitral valve is normal in structure. Trivial mitral valve regurgitation. MV peak gradient, 5.2 mmHg. The mean mitral valve gradient is 1.0 mmHg.  Tricuspid Valve: The tricuspid valve is normal in structure. Tricuspid valve regurgitation is trivial. Aortic Valve: The aortic valve is normal in structure. Aortic valve regurgitation is not visualized. Aortic valve mean gradient measures 3.0 mmHg. Aortic valve peak gradient measures 6.2 mmHg. Aortic valve area, by VTI measures 2.37 cm. Pulmonic Valve: The pulmonic valve was normal in structure. Pulmonic valve regurgitation is not visualized. Aorta: The ascending aorta was not well visualized. IAS/Shunts: No atrial level shunt detected by color flow Doppler. Additional Comments: 3D was performed not requiring image post processing on an independent workstation and was indeterminate.  LEFT VENTRICLE PLAX 2D LVIDd:         4.20 cm   Diastology LVIDs:         3.00 cm   LV e' medial:    5.98 cm/s LV PW:         1.10 cm   LV E/e' medial:  13.7 LV IVS:        1.20 cm   LV e' lateral:   8.38 cm/s LVOT diam:     1.90 cm   LV E/e' lateral: 9.8 LV SV:         56 LV SV Index:   31 LVOT Area:     2.84 cm  RIGHT VENTRICLE RV Basal diam:  3.00 cm RV Mid diam:    2.60 cm RV S prime:     14.10 cm/s TAPSE (M-mode): 2.6 cm LEFT ATRIUM             Index        RIGHT ATRIUM           Index LA diam:        4.00 cm 2.24 cm/m   RA Area:     15.20 cm LA Vol (A2C):   36.7 ml 20.59 ml/m  RA Volume:   35.80 ml  20.08 ml/m LA Vol (A4C):   58.2 ml 32.64 ml/m LA Biplane Vol: 46.6 ml 26.14 ml/m  AORTIC VALVE                     PULMONIC VALVE AV Area (Vmax):    2.34 cm     PV Vmax:          0.75 m/s AV Area (Vmean):   2.31 cm     PV Peak grad:     2.3 mmHg AV Area (VTI):     2.37 cm     PR End Diast Vel: 3.00 msec AV Vmax:           125.00 cm/s  RVOT Peak grad:   1 mmHg AV Vmean:          80.700 cm/s AV VTI:            0.234 m AV Peak Grad:      6.2 mmHg AV Mean Grad:      3.0 mmHg LVOT Vmax:         103.00 cm/s LVOT Vmean:        65.800 cm/s LVOT VTI:          0.196 m LVOT/AV VTI ratio: 0.84  AORTA Ao Root diam: 2.70 cm Ao Asc diam:  3.00 cm MITRAL VALVE                TRICUSPID VALVE MV Area (PHT): 3.37 cm     TR Peak grad:   28.7 mmHg MV Area VTI:   2.14 cm  TR Vmax:        268.00 cm/s MV Peak grad:  5.2 mmHg MV Mean grad:  1.0 mmHg     SHUNTS MV Vmax:       1.14 m/s     Systemic VTI:  0.20 m MV Vmean:      55.7 cm/s    Systemic Diam: 1.90 cm MV Decel Time: 225 msec MV E velocity: 81.80 cm/s MV A velocity: 101.00 cm/s MV E/A ratio:  0.81 MV A Prime:    10.2 cm/s Antonette Batters MD Electronically signed by Antonette Batters MD Signature Date/Time: 09/09/2023/2:40:15 PM    Final    MR BRAIN WO CONTRAST Result Date: 09/08/2023 CLINICAL DATA:  Initial evaluation for acute mental status change, unknown cause. EXAM: MRI HEAD WITHOUT CONTRAST TECHNIQUE: Multiplanar, multiecho pulse sequences of the brain and surrounding structures were obtained without intravenous contrast. COMPARISON:  CT from earlier the same day. FINDINGS: Brain: Cerebral volume within normal limits for age. Patchy T2/FLAIR hyperintensity involving the supratentorial cerebral white matter, consistent with chronic small vessel ischemic disease, moderately advanced in nature. Restricted diffusion involving the left thalamus with extension towards the mesial left temporal lobe, consistent with an acute ischemic infarct (series 5, image 31). No associated hemorrhage or significant mass effect. No other evidence for acute or subacute ischemia. No acute intracranial  hemorrhage. Multiple scattered chronic micro hemorrhages noted, likely hypertensive in nature. No mass lesion, midline shift or mass effect. No hydrocephalus or extra-axial fluid collection. Pituitary gland within normal limits. Vascular: Major intracranial vascular flow voids are maintained. Skull and upper cervical spine: Craniocervical junction within normal limits. Bone marrow signal intensity normal. No scalp soft tissue abnormality. Sinuses/Orbits: Prior bilateral ocular lens replacement. Paranasal sinuses are largely clear. No significant mastoid effusion. Other: None. IMPRESSION: 1. Acute ischemic nonhemorrhagic infarct involving the left thalamus as above. 2. Underlying moderately advanced chronic microvascular ischemic disease. Electronically Signed   By: Virgia Griffins M.D.   On: 09/08/2023 23:05   CT HEAD WO CONTRAST Result Date: 09/08/2023 CLINICAL DATA:  Aphasia and right-sided weakness EXAM: CT HEAD WITHOUT CONTRAST TECHNIQUE: Contiguous axial images were obtained from the base of the skull through the vertex without intravenous contrast. RADIATION DOSE REDUCTION: This exam was performed according to the departmental dose-optimization program which includes automated exposure control, adjustment of the mA and/or kV according to patient size and/or use of iterative reconstruction technique. COMPARISON:  08/26/2016 FINDINGS: Brain: No evidence of acute infarction, hemorrhage, hydrocephalus, extra-axial collection or mass lesion/mass effect. Mild atrophic changes and chronic white matter ischemic changes are noted. Vascular: No hyperdense vessel or unexpected calcification. Skull: Normal. Negative for fracture or focal lesion. Sinuses/Orbits: No acute finding. Other: None. IMPRESSION: Chronic atrophic and ischemic changes without acute abnormality. Electronically Signed   By: Violeta Grey M.D.   On: 09/08/2023 19:19   MM 3D SCREENING MAMMOGRAM BILATERAL BREAST Result Date: 08/31/2023 CLINICAL  DATA:  Screening. EXAM: DIGITAL SCREENING BILATERAL MAMMOGRAM WITH TOMOSYNTHESIS AND CAD TECHNIQUE: Bilateral screening digital craniocaudal and mediolateral oblique mammograms were obtained. Bilateral screening digital breast tomosynthesis was performed. The images were evaluated with computer-aided detection. COMPARISON:  Previous exam(s). ACR Breast Density Category c: The breasts are heterogeneously dense, which may obscure small masses. FINDINGS: There are no findings suspicious for malignancy. IMPRESSION: No mammographic evidence of malignancy. A result letter of this screening mammogram will be mailed directly to the patient. RECOMMENDATION: Screening mammogram in one year. (Code:[id number]-01Y) BI-RADS CATEGORY  1: Negative. Electronically Signed  By: Sundra Engel M.D.   On: 08/31/2023 11:47      Labs: BNP (last 3 results) No results for input(s): "BNP" in the last 8760 hours. Basic Metabolic Panel: Recent Labs  Lab 09/08/23 1702 09/09/23 0427  NA 140 140  K 4.6 4.0  CL 107 109  CO2 22 24  GLUCOSE 118* 147*  BUN 30* 40*  CREATININE 1.69* 1.53*  CALCIUM  9.5 9.0   Liver Function Tests: Recent Labs  Lab 09/08/23 1702  AST 20  ALT 14  ALKPHOS 42  BILITOT 1.2  PROT 7.2  ALBUMIN 4.3   No results for input(s): "LIPASE", "AMYLASE" in the last 168 hours. No results for input(s): "AMMONIA" in the last 168 hours. CBC: Recent Labs  Lab 09/08/23 1702 09/09/23 0427  WBC 15.1* 12.7*  NEUTROABS 12.7*  --   HGB 14.2 12.7  HCT 44.7 37.8  MCV 94.7 92.9  PLT 294 255   Cardiac Enzymes: No results for input(s): "CKTOTAL", "CKMB", "CKMBINDEX", "TROPONINI" in the last 168 hours. BNP: Invalid input(s): "POCBNP" CBG: No results for input(s): "GLUCAP" in the last 168 hours. D-Dimer No results for input(s): "DDIMER" in the last 72 hours. Hgb A1c Recent Labs    09/09/23 0427  HGBA1C 5.5   Lipid Profile Recent Labs    09/09/23 0427  CHOL 164  HDL 46  LDLCALC 107*  TRIG 57   CHOLHDL 3.6   Thyroid function studies No results for input(s): "TSH", "T4TOTAL", "T3FREE", "THYROIDAB" in the last 72 hours.  Invalid input(s): "FREET3" Anemia work up No results for input(s): "VITAMINB12", "FOLATE", "FERRITIN", "TIBC", "IRON", "RETICCTPCT" in the last 72 hours. Urinalysis    Component Value Date/Time   COLORURINE YELLOW (A) 09/08/2023 2251   APPEARANCEUR CLOUDY (A) 09/08/2023 2251   APPEARANCEUR Hazy 06/02/2013 1535   LABSPEC 1.019 09/08/2023 2251   LABSPEC 1.013 06/02/2013 1535   PHURINE 5.0 09/08/2023 2251   GLUCOSEU NEGATIVE 09/08/2023 2251   GLUCOSEU Negative 06/02/2013 1535   HGBUR NEGATIVE 09/08/2023 2251   BILIRUBINUR NEGATIVE 09/08/2023 2251   BILIRUBINUR Negative 06/02/2013 1535   KETONESUR NEGATIVE 09/08/2023 2251   PROTEINUR 30 (A) 09/08/2023 2251   NITRITE NEGATIVE 09/08/2023 2251   LEUKOCYTESUR TRACE (A) 09/08/2023 2251   LEUKOCYTESUR Negative 06/02/2013 1535   Sepsis Labs Recent Labs  Lab 09/08/23 1702 09/09/23 0427  WBC 15.1* 12.7*   Microbiology No results found for this or any previous visit (from the past 240 hours).   Total time spend on discharging this patient, including the last patient exam, discussing the hospital stay, instructions for ongoing care as it relates to all pertinent caregivers, as well as preparing the medical discharge records, prescriptions, and/or referrals as applicable, is 35 minutes.    Garrison Kanner, MD  [facility] 09/10/2023, 11:39 AM

## 2023-09-10 NOTE — Plan of Care (Signed)
 Neurology plan of care  This is a 89 yo woman with pmhx HTN who presented from facility for 2 days of confusion and difficulty putting her sentences together. MRI brain wo contrast showed acute ischemic infarct in the L thalamus. Stroke workup has been completed; results as follows:  CTA H&N 1. No large vessel occlusion. 2. Moderate left distal P2 PCA stenosis. 3. 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 4. Ulcerated atherosclerosis at the left carotid bifurcation with approximately 3 mm aneurysm at the ECA origin.  TTE 1. No large vessel occlusion. 2. Moderate left distal P2 PCA stenosis. 3. 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 4. Ulcerated atherosclerosis at the left carotid bifurcation with approximately 3 mm aneurysm at the ECA origin.  Stroke Labs     Component Value Date/Time   CHOL 164 09/09/2023 0427   TRIG 57 09/09/2023 0427   HDL 46 09/09/2023 0427   CHOLHDL 3.6 09/09/2023 0427   VLDL 11 09/09/2023 0427   LDLCALC 107 (H) 09/09/2023 0427    Lab Results  Component Value Date/Time   HGBA1C 5.5 09/09/2023 04:27 AM   A/P: 89 yo woman with hx HTN who presented with confusion and was found to have acute infarct in L thalamus. The thalamus serves as a relay center between the cortex and the rest of the CNS and stroke there can cause altered mental status particularly in the elderly. Etiology is favored to be small vessel disease. Stroke workup is completed and patient is being discharged to her facility today.  Recommendations: - ASA 81mg  daily + plavix 75mg  daily x21 days f/b ASA 81mg  daily monotherapy after that - Atorvastatin 40mg  daily - I will arrange outpatient neurology f/u  D/w Dr. Quenton Bruns, MD [facility] [phone]  If 7pm- 7am, please page neurology on call as listed in AMION.

## 2023-09-10 NOTE — TOC Progression Note (Signed)
 Transition of Care [facility]) - Progression Note    Patient Details  Name: Cheryl Rosales MRN: [id number] Date of Birth: 08/20/1934  [facility]) CM/SW Contact  Alexandra Ice, RN Phone Number: 09/10/2023, 11:14 AM  Clinical Narrative:     Received message from Crystal at [facility], confirmed they received approval for patient SNF level of care at [facility].  TOC received PASRR number and FL2, which is pending MD signature.  Updated MD and bedside nurse.   Expected Discharge Plan: Skilled Nursing Facility Barriers to Discharge: Continued Medical Work up  Expected Discharge Plan and Services   Discharge Planning Services: CM Consult Post Acute Care Choice: Durable Medical Equipment (Shower chair recommended by OT) Living arrangements for the past 2 months: Independent Living Facility                                       Social Determinants of Health (SDOH) Interventions SDOH Screenings   Food Insecurity: No Food Insecurity (09/09/2023)  Housing: Low Risk  (09/09/2023)  Transportation Needs: No Transportation Needs (09/09/2023)  Utilities: Not At Risk (09/09/2023)  Financial Resource Strain: Low Risk  (09/07/2023)   Received from [facility]  Social Connections: Socially Isolated (09/09/2023)  Tobacco Use: Low Risk  (09/09/2023)    Readmission Risk Interventions     No data to display

## 2023-09-10 NOTE — Progress Notes (Signed)
 Pt discharged to assisted living facility at [facility]: [name] Report called to receiving RN. Pt's son transported pt to facility via private vehicle. Pt given all medications prior to discharge and ate lunch. No distress noted. PIV removed. All questions and concerns from pt and family answered.

## 2023-09-10 NOTE — [place] FL2 (Signed)
 [place]  MEDICAID [id number] LEVEL OF CARE FORM     IDENTIFICATION  Patient Name: Cheryl Rosales Birthdate: 24-Jun-1934 Sex: female Admission Date (Current Location): 09/08/2023  [facility] and [place] Number:  Chiropodist and Address:  [facility], [address], [place], [place] [address]      Provider Number: [id number]  Attending Physician Name and Address:  Garrison Kanner, MD  Relative Name and Phone Number:  Lotoya, Casella, son, [phone]    Current Level of Care: Hospital Recommended Level of Care: Skilled Nursing Facility Prior Approval Number:    Date Approved/Denied:   PASRR Number: [id number]  Discharge Plan: SNF    Current Diagnoses: Patient Active Problem List   Diagnosis Date Noted   Cerebrovascular accident (CVA) (HCC) 09/09/2023   AKI (acute kidney injury) (HCC) 09/09/2023   Leukocytosis 09/09/2023   Stroke (HCC) 09/09/2023   Left thalamic infarction (HCC) 09/08/2023   Carpal tunnel syndrome, bilateral 10/30/2018   Family history of thyroid cancer 10/30/2018   Fibrocystic breast disease 10/30/2018   History of torn meniscus of left knee 10/30/2018   Neurocardiogenic syncope 10/30/2018   Osteoporosis, post-menopausal 10/30/2018   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/2018   Hyperlipidemia 08/20/2016   Pulmonary embolism (HCC) 11/29/2014   Pulmonary emboli (HCC) 11/29/2014    Orientation RESPIRATION BLADDER Height & Weight     Self, Time, Situation, Place  Normal Incontinent Weight: 72 kg Height:  5' 4.5" (163.8 cm)  BEHAVIORAL SYMPTOMS/MOOD NEUROLOGICAL BOWEL NUTRITION STATUS   (N/A)   Continent Diet (Heart Healthy, thin liquids)  AMBULATORY STATUS COMMUNICATION OF NEEDS Skin   Limited Assist Verbally Normal                       Personal Care Assistance Level of Assistance  Bathing, Feeding, Dressing Bathing Assistance: Limited assistance Feeding assistance: Limited assistance Dressing Assistance: Limited assistance      Functional Limitations Info   (N/A)          SPECIAL CARE FACTORS FREQUENCY  PT (By licensed PT), OT (By licensed OT)     PT Frequency: 5 times per week OT Frequency: 5 times per week     Speech Therapy Frequency: 3x/week      Contractures Contractures Info: Not present    Additional Factors Info  Code Status, Allergies Code Status Info: DNR - limited Allergies Info: Codeine, Demerol, Evista, Hydrocodone, Oxycodone           Current Medications (09/10/2023):  This is the current hospital active medication list Current Facility-Administered Medications  Medication Dose Route Frequency Provider Last Rate Last Admin   acetaminophen  (TYLENOL ) tablet 650 mg  650 mg Oral Q4H PRN Amponsah, Prosper M, MD       Or   acetaminophen  (TYLENOL ) 160 MG/5ML solution 650 mg  650 mg Per Tube Q4H PRN Amponsah, Prosper M, MD       Or   acetaminophen  (TYLENOL ) suppository 650 mg  650 mg Rectal Q4H PRN Amponsah, Prosper M, MD       aspirin EC tablet 81 mg  81 mg Oral Daily Garrison Kanner, MD   81 mg at 09/10/23 0942   atorvastatin (LIPITOR) tablet 40 mg  40 mg Oral Daily Garrison Kanner, MD   40 mg at 09/10/23 0942   enoxaparin (LOVENOX) injection 30 mg  30 mg Subcutaneous Q24H Amponsah, Prosper M, MD   30 mg at 09/10/23 0815   fesoterodine (TOVIAZ) tablet 4 mg  4 mg Oral  Daily Garrison Kanner, MD       montelukast (SINGULAIR) tablet 10 mg  10 mg Oral Daily Garrison Kanner, MD       senna-docusate (Senokot-S) tablet 1 tablet  1 tablet Oral QHS PRN Amponsah, Prosper M, MD         Discharge Medications: Please see discharge summary for a list of discharge medications.  Relevant Imaging Results:  Relevant Lab Results:   Additional Information SSN: [id number]  Alexandra Ice, RN

## 2023-09-10 NOTE — Progress Notes (Signed)
 Physical Therapy Treatment Patient Details Name: Cheryl Rosales MRN: [id number] DOB: Feb 07, 1935 Today's Date: 09/10/2023   History of Present Illness 89 y/o presented to ED on 09/08/23 from [facility] for AMS and periods of aphasia along with R sided weakness. MRI found acute ischemic L thalamic infarct. PMH: HTN    PT Comments  Overall, pt demonstrated improved activity tolerance and safety with mobility, transfers, and gait with AD.   Pt demonstrates greater stability with 2 wheeled RW and pt is able to self correct drifting to the left at supervision level. Pt requires CGA with SPC 2/2 lateral imbalances. Continued PT will assist pt towards greater dynamic standing balance, LE strengthening, and activity tolerance to increase safety and independence and decrease burden of care with functional mobility.     If plan is discharge home, recommend the following: A little help with walking and/or transfers;A little help with bathing/dressing/bathroom;Assistance with cooking/housework;Direct supervision/assist for medications management;Direct supervision/assist for financial management;Supervision due to cognitive status;Assist for transportation   Can travel by private vehicle        Equipment Recommendations  Rolling walker (2 wheels)    Recommendations for Other Services       Precautions / Restrictions Precautions Precautions: Fall Restrictions Weight Bearing Restrictions Per Provider Order: No     Mobility  Bed Mobility Overal bed mobility: Modified Independent                  Transfers Overall transfer level: Needs assistance Equipment used: Straight cane, Rolling walker (2 wheels) Transfers: Sit to/from Stand, Bed to chair/wheelchair/BSC Sit to Stand: Supervision, Contact guard assist, Min assist   Step pivot transfers: Supervision, Contact guard assist       General transfer comment: CGA- MinA with no AD  2/2 intermittent imbalances to the Left, LOB x1  sit>standa.  Supervision with RW.    Ambulation/Gait Ambulation/Gait assistance: Contact guard assist, Supervision Gait Distance (Feet): 150 Feet Assistive device: Rolling walker (2 wheels), Straight cane Gait Pattern/deviations: Step-through pattern, Decreased stride length, Drifts right/left Gait velocity: decreased (hesitant)     General Gait Details: Demonstrates greater stability with 2 wheeled RW and pt is able to self correct drifting to the left at supervision level.  Pt requires CGA with SPC 2/2 lateral imbalances.   Stairs             Wheelchair Mobility     Tilt Bed    Modified Rankin (Stroke Patients Only)       Balance Overall balance assessment: Needs assistance Sitting-balance support: Feet unsupported, No upper extremity supported Sitting balance-Leahy Scale: Good     Standing balance support: Single extremity supported, No upper extremity supported, During functional activity, Reliant on assistive device for balance Standing balance-Leahy Scale: Fair Standing balance comment: Overall supervision with 2 UE support;  supervision to intermittent Min A without UE support or 1 UE support 2/2 LOB x1 with standing balance requiring Min A to correct.                            Communication Communication Communication: Impaired Factors Affecting Communication: Difficulty expressing self  Cognition Arousal: Alert Behavior During Therapy: WFL for tasks assessed/performed   PT - Cognitive impairments: Awareness, Problem solving, Safety/Judgement                         Following commands: Intact Following commands impaired: Only follows one step commands consistently  Cueing Cueing Techniques: Verbal cues  Exercises      General Comments        Pertinent Vitals/Pain Pain Assessment Pain Assessment: No/denies pain    Home Living                          Prior Function            PT Goals (current goals  can now be found in the care plan section) Acute Rehab PT Goals Patient Stated Goal: to go back to her ILF PT Goal Formulation: With patient Time For Goal Achievement: 09/23/23 Potential to Achieve Goals: Good Progress towards PT goals: Progressing toward goals    Frequency    Min 3X/week      PT Plan      Co-evaluation              AM-PAC PT "6 Clicks" Mobility   Outcome Measure  Help needed turning from your back to your side while in a flat bed without using bedrails?: None Help needed moving from lying on your back to sitting on the side of a flat bed without using bedrails?: None Help needed moving to and from a bed to a chair (including a wheelchair)?: A Little Help needed standing up from a chair using your arms (e.g., wheelchair or bedside chair)?: A Little Help needed to walk in hospital room?: A Little Help needed climbing 3-5 steps with a railing? : A Little 6 Click Score: 20    End of Session Equipment Utilized During Treatment: Gait belt Activity Tolerance: Patient tolerated treatment well Patient left: in chair;with call bell/phone within reach;with chair alarm set;with family/visitor present;with nursing/sitter in room Nurse Communication: Mobility status PT Visit Diagnosis: Muscle weakness (generalized) (M62.81);Unsteadiness on feet (R26.81)     Time: 5784-6962 PT Time Calculation (min) (ACUTE ONLY): 22 min  Charges:    $Therapeutic Activity: 8-22 mins PT General Charges $$ ACUTE PT VISIT: 1 Visit                     Eliazar Gross, PTA  09/10/23, 9:28 AM

## 2023-09-10 NOTE — Plan of Care (Signed)

## 2023-09-10 NOTE — TOC Transition Note (Signed)
 Transition of Care [facility]) - Discharge Note   Patient Details  Name: Cheryl Rosales MRN: [id number] Date of Birth: Mar 02, 1935  [facility] CM/SW Contact:  Alexandra Ice, RN Phone Number: 09/10/2023, 1:32 PM   Clinical Narrative:     Discharge summary and orders sent to facility via HUB. Received confirmation it was received. Family to transport patient to facility. Patient going to room 116, number for report [phone]. Notified bedside nurse.   Final next level of care: Skilled Nursing Facility Barriers to Discharge: Barriers Resolved   Patient Goals and CMS Choice Patient states their goals for this hospitalization and ongoing recovery are:: "To go home and be able to drive" CMS Medicare.gov Compare Post Acute Care list provided to:: Patient Choice offered to / list presented to : Patient      Discharge Placement              Patient chooses bed at: [facility] Patient to be transferred to facility by: Son Name of family member notified: Son Patient and family notified of of transfer: 09/10/23  Discharge Plan and Services Additional resources added to the After Visit Summary for     Discharge Planning Services: CM Consult Post Acute Care Choice: Durable Medical Equipment (Shower chair recommended by OT)            DME Agency: NA       HH Arranged: NA          Social Drivers of Health (SDOH) Interventions SDOH Screenings   Food Insecurity: No Food Insecurity (09/09/2023)  Housing: Low Risk  (09/09/2023)  Transportation Needs: No Transportation Needs (09/09/2023)  Utilities: Not At Risk (09/09/2023)  Financial Resource Strain: Low Risk  (09/07/2023)   Received from [facility]  Social Connections: Socially Isolated (09/09/2023)  Tobacco Use: Low Risk  (09/09/2023)     Readmission Risk Interventions     No data to display

## 2023-09-14 ENCOUNTER — Encounter: Payer: Self-pay | Admitting: Student

## 2023-09-14 ENCOUNTER — Non-Acute Institutional Stay (SKILLED_NURSING_FACILITY): Admitting: Student

## 2023-09-14 DIAGNOSIS — R7303 Prediabetes: Secondary | ICD-10-CM | POA: Diagnosis not present

## 2023-09-14 DIAGNOSIS — I6381 Other cerebral infarction due to occlusion or stenosis of small artery: Secondary | ICD-10-CM

## 2023-09-14 DIAGNOSIS — E7849 Other hyperlipidemia: Secondary | ICD-10-CM

## 2023-09-14 DIAGNOSIS — I10 Essential (primary) hypertension: Secondary | ICD-10-CM

## 2023-09-14 DIAGNOSIS — N1832 Chronic kidney disease, stage 3b: Secondary | ICD-10-CM

## 2023-09-14 DIAGNOSIS — N3281 Overactive bladder: Secondary | ICD-10-CM | POA: Diagnosis not present

## 2023-09-14 NOTE — Progress Notes (Signed)
 Provider:  Dr. Valrie Gehrig Location:  Other [facility] [place].  Nursing Home Room Number: [place]. [address]A Place of Service:  SNF (31)  PCP: Cheryl Helms, MD Patient Care Team: Cheryl Helms, MD as PCP - General (Internal Medicine)  Extended Emergency Contact Information Primary Emergency Contact: Tillett,Lewis III Address: [address]          [place], [place] Home Phone: [phone] Relation: Son  Code Status: DNR Goals of Care: Advanced Directive information    09/14/2023    8:26 AM  Advanced Directives  Does Patient Have a Medical Advance Directive? Yes  Type of Advance Directive Out of facility DNR (pink MOST or yellow form)  Does patient want to make changes to medical advance directive? No - Patient declined      Chief Complaint  Patient presents with   New Admit To SNF    New Admission.     HPI: Patient is a 89 y.o. female seen today for admission to [facility].   History of Present Illness The patient is an 89 year old who presents for follow-up after a recent stroke.  She was recently hospitalized due to a stroke, which primarily affected her cognitive functions, leading to confusion and difficulty with clear thinking. She is able to walk and communicate well but continues to experience cognitive challenges. She does not recall the events leading to her hospitalization but was noted to have confusion and weakness prior to admission.  Her current medications include aspirin and Plavix. She is also on atorvastatin for cholesterol management, losartan 100 mg for blood pressure, and Zyrtec as needed for allergies. She takes montelukast daily, although she is unsure of its purpose, and a medication for overactive bladder, which she has been on for less than six months.  She lives independently on campus and managed her finances and medications independently before the stroke. She has a son and grandchildren in [place], and her  son, Cheryl Rosales, is her emergency contact. She stopped cooking after moving to the rehab facility and misses driving, which she used to do regularly.  She has recently started wearing braces, which she has had for less than a month, and expects to wear them for about a year. She engages in activities such as reading and attending classes at [employer] three days a week.  No issues with bowel movements or urination. She is able to stand and has good strength in her hands.   Since being in the facility - patient has "lost" her jacket and cell phone in plain sight.  Past Medical History:  Diagnosis Date   Arthritis    knees, fingers, thumbs   Cancer (HCC)    skin   H/O blood clots 2016   bilateral lungs - attributed to Evista use   Hypertension    Neurocardiogenic syncope    Past Surgical History:  Procedure Laterality Date   APPENDECTOMY     BREAST BIOPSY Left    neg   CATARACT EXTRACTION W/PHACO Left 02/04/2016   Procedure: CATARACT EXTRACTION PHACO AND INTRAOCULAR LENS PLACEMENT (IOC);  Surgeon: Annell Kidney, MD;  Location: [facility];  Service: Ophthalmology;  Laterality: Left;  PT WOULD LIKE EARLY AM   CATARACT EXTRACTION W/PHACO Right 02/25/2016   Procedure: CATARACT EXTRACTION PHACO AND INTRAOCULAR LENS PLACEMENT (IOC);  Surgeon: Annell Kidney, MD;  Location: [facility];  Service: Ophthalmology;  Laterality: Right;  PT PREFERS EARLY AM   COLONOSCOPY     TONSILLECTOMY  reports that she has never smoked. She has never used smokeless tobacco. She reports that she does not drink alcohol. No history on file for drug use. Social History   Socioeconomic History   Marital status: Married    Spouse name: Not on file   Number of children: Not on file   Years of education: Not on file   Highest education level: Not on file  Occupational History   Not on file  Tobacco Use   Smoking status: Never   Smokeless tobacco: Never  Substance and Sexual  Activity   Alcohol use: No   Drug use: Not on file   Sexual activity: Not on file  Other Topics Concern   Not on file  Social History Narrative   Not on file   Social Drivers of Health   Financial Resource Strain: Low Risk  (09/07/2023)   Received from [facility]   Overall Financial Resource Strain (CARDIA)    Difficulty of Paying Living Expenses: Not hard at all  Food Insecurity: No Food Insecurity (09/09/2023)   Hunger Vital Sign    Worried About Running Out of Food in the Last Year: Never true    Ran Out of Food in the Last Year: Never true  Transportation Needs: No Transportation Needs (09/09/2023)   PRAPARE - Administrator, Civil Service (Medical): No    Lack of Transportation (Non-Medical): No  Physical Activity: Not on file  Stress: Not on file  Social Connections: Socially Isolated (09/09/2023)   Social Connection and Isolation Panel [NHANES]    Frequency of Communication with Friends and Family: More than three times a week    Frequency of Social Gatherings with Friends and Family: Twice a week    Attends Religious Services: Never    Database administrator or Organizations: No    Attends Banker Meetings: Never    Marital Status: Widowed  Intimate Partner Violence: Not At Risk (09/09/2023)   Humiliation, Afraid, Rape, and Kick questionnaire    Fear of Current or Ex-Partner: No    Emotionally Abused: No    Physically Abused: No    Sexually Abused: No    Functional Status Survey:    Family History  Problem Relation Age of Onset   Breast cancer Mother 17   Breast cancer Paternal Aunt    Breast cancer Maternal Grandmother        great    Health Maintenance  Topic Date Due   Medicare Annual Wellness (AWV)  Never done   Pneumonia Vaccine 42+ Years old (1 of 1 - PCV) Never done   DEXA SCAN  Never done   COVID-19 Vaccine (3 - Moderna risk series) 07/02/2019   INFLUENZA VACCINE  11/18/2023   DTaP/Tdap/Td (2 - Td or  Tdap) 01/02/2025   Zoster Vaccines- Shingrix  Completed   HPV VACCINES  Aged Out   Meningococcal B Vaccine  Aged Out    Allergies  Allergen Reactions   Mobic  [Meloxicam ] Shortness Of Breath    Per pt report   Codeine Swelling   Hydrocodone Nausea And Vomiting   Oxycodone Nausea And Vomiting   Raloxifene Other (See Comments)    Pt reports MD told her possibly gave her blood clots  Pt reports MD told her possibly gave her blood clots  Pt reports MD told her possibly gave her blood clots   Meperidine Swelling and Dermatitis    Outpatient Encounter Medications as of 09/14/2023  Medication Sig  acetaminophen  (ARTHRITIS PAIN) 650 MG CR tablet Take 650 mg by mouth every 8 (eight) hours as needed for pain.   aspirin EC 81 MG tablet Take 1 tablet (81 mg total) by mouth daily. Swallow whole.   atorvastatin (LIPITOR) 40 MG tablet Take 1 tablet (40 mg total) by mouth daily.   cetirizine (ZYRTEC) 10 MG tablet Take 10 mg by mouth daily as needed for allergies.   clopidogrel (PLAVIX) 75 MG tablet Take 1 tablet (75 mg total) by mouth daily for 21 days.   losartan (COZAAR) 100 MG tablet Take 100 mg by mouth daily.   montelukast (SINGULAIR) 10 MG tablet Take 10 mg by mouth daily.   solifenacin (VESICARE) 10 MG tablet Take 10 mg by mouth daily.   No facility-administered encounter medications on file as of 09/14/2023.    Review of Systems  Vitals:   09/14/23 0820 09/14/23 0829  BP: (!) 148/69 130/76  Pulse: 68   Resp: 18   Temp: (!) 96.6 F (35.9 C)   SpO2: 96%   Weight: 163 lb (73.9 kg)   Height: 5\' 4"  (1.626 m)    Body mass index is 27.98 kg/m. Physical Exam Constitutional:      Appearance: Normal appearance.  Cardiovascular:     Rate and Rhythm: Normal rate and regular rhythm.     Pulses: Normal pulses.     Heart sounds: Normal heart sounds.  Pulmonary:     Effort: Pulmonary effort is normal.  Abdominal:     General: Abdomen is flat. Bowel sounds are normal.      Palpations: Abdomen is soft.  Musculoskeletal:        General: No swelling or tenderness.  Skin:    General: Skin is warm and dry.  Neurological:     Mental Status: She is alert. She is disoriented.  Psychiatric:        Mood and Affect: Mood normal.     Labs reviewed: Basic Metabolic Panel: Recent Labs    09/08/23 1702 09/09/23 0427  NA 140 140  K 4.6 4.0  CL 107 109  CO2 22 24  GLUCOSE 118* 147*  BUN 30* 40*  CREATININE 1.69* 1.53*  CALCIUM  9.5 9.0   Liver Function Tests: Recent Labs    09/08/23 1702  AST 20  ALT 14  ALKPHOS 42  BILITOT 1.2  PROT 7.2  ALBUMIN 4.3   No results for input(s): "LIPASE", "AMYLASE" in the last 8760 hours. No results for input(s): "AMMONIA" in the last 8760 hours. CBC: Recent Labs    09/08/23 1702 09/09/23 0427  WBC 15.1* 12.7*  NEUTROABS 12.7*  --   HGB 14.2 12.7  HCT 44.7 37.8  MCV 94.7 92.9  PLT 294 255   Cardiac Enzymes: No results for input(s): "CKTOTAL", "CKMB", "CKMBINDEX", "TROPONINI" in the last 8760 hours. BNP: Invalid input(s): "POCBNP" Lab Results  Component Value Date   HGBA1C 5.5 09/09/2023   No results found for: "TSH" No results found for: "VITAMINB12" No results found for: "FOLATE" No results found for: "IRON", "TIBC", "FERRITIN"  Imaging and Procedures obtained prior to SNF admission: CT ANGIO HEAD NECK W WO CM Result Date: 09/09/2023 CLINICAL DATA:  Neuro deficit, acute, stroke suspected EXAM: CT ANGIOGRAPHY HEAD AND NECK WITH AND WITHOUT CONTRAST TECHNIQUE: Multidetector CT imaging of the head and neck was performed using the standard protocol during bolus administration of intravenous contrast. Multiplanar CT image reconstructions and MIPs were obtained to evaluate the vascular anatomy. Carotid stenosis measurements (when applicable)  are obtained utilizing NASCET criteria, using the distal internal carotid diameter as the denominator. RADIATION DOSE REDUCTION: This exam was performed according to the  departmental dose-optimization program which includes automated exposure control, adjustment of the mA and/or kV according to patient size and/or use of iterative reconstruction technique. CONTRAST:  50mL OMNIPAQUE  IOHEXOL  350 MG/ML SOLN COMPARISON:  CT head May 22, 25. FINDINGS: CT HEAD FINDINGS Brain: No evidence of acute infarction, hemorrhage, hydrocephalus, extra-axial collection or mass lesion/mass effect. Remote right basal ganglia lacunar infarct. Patchy white matter hypodensities, compatible with chronic microvascular ischemic disease. Vascular: See below. Skull: No acute fracture. Sinuses/Orbits: Clear sinuses.  No acute orbital findings. Other: No mastoid effusions. Review of the MIP images confirms the above findings CTA NECK FINDINGS Aortic arch: Great vessel origins appear patent. Right carotid system: Atherosclerosis at the carotid bifurcation without greater than 50% stenosis. Left carotid system: Ulcerated atherosclerosis at the carotid bifurcation without greater than 50% stenosis. Approximately outpouching along the medial aspect of the carotid bifurcation which includes the origin of the EAC, compatible with aneurysm. Vertebral arteries: Codominant. No evidence of dissection, stenosis (50% or greater), or occlusion. Skeleton: No acute fracture on limited assessment. Other neck: No acute abnormality on limited assessment. Upper chest: Visualized lung apices are clear. Review of the MIP images confirms the above findings CTA HEAD FINDINGS Anterior circulation: Bilateral intracranial ICAs,, MCAs, and ACAs are patent without proximal hemodynamically significant stenosis. Approximately 3 mm anteriorly directed outpouching rising from the left carotid terminus, compatible with aneurysm (series 11, image 104). Posterior circulation: Bilateral intradural vertebral arteries, basilar artery and bilateral post cerebral arteries are patent. Moderate left distal P2 PCA stenosis. Right fetal type PCA, anatomic  variant. Venous sinuses: As permitted by contrast timing, patent. Review of the MIP images confirms the above findings IMPRESSION: 1. No large vessel occlusion. 2. Moderate left distal P2 PCA stenosis. 3. 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 4. Ulcerated atherosclerosis at the left carotid bifurcation with approximately 3 mm aneurysm at the ECA origin. Electronically Signed   By: Stevenson Elbe M.D.   On: 09/09/2023 19:12   ECHOCARDIOGRAM COMPLETE Result Date: 09/09/2023    ECHOCARDIOGRAM REPORT   Patient Name:   Cheryl Rosales Date of Exam: 09/09/2023 Medical Rec #:  [id number]         Height:       64.5 in Accession #:    [id number]        Weight:       158.7 lb Date of Birth:  1934-10-28         BSA:          1.783 m Patient Age:    89 years          BP:           166/120 mmHg Patient Gender: F                 HR:           66 bpm. Exam Location:  [facility] Procedure: 2D Echo, Cardiac Doppler and Color Doppler (Both Spectral and Color            Flow Doppler were utilized during procedure). Indications:     Stroke I63.9  History:         Patient has prior history of Echocardiogram examinations. Risk                  Factors:Hypertension.  Sonographer:     Actuary  Referring Phys:  [id number] Annette Killings AMPONSAH Diagnosing Phys: Antonette Batters MD IMPRESSIONS  1. Left ventricular ejection fraction, by estimation, is 55 to 60%. The left ventricle has normal function. The left ventricle has no regional wall motion abnormalities. Left ventricular diastolic parameters are consistent with Grade I diastolic dysfunction (impaired relaxation).  2. Right ventricular systolic function is normal. The right ventricular size is normal.  3. Left atrial size was mildly dilated.  4. Right atrial size was mildly dilated.  5. The mitral valve is normal in structure. Trivial mitral valve regurgitation.  6. The aortic valve is normal in structure. Aortic valve regurgitation is not visualized. FINDINGS  Left  Ventricle: Left ventricular ejection fraction, by estimation, is 55 to 60%. The left ventricle has normal function. The left ventricle has no regional wall motion abnormalities. Strain was performed and the global longitudinal strain is indeterminate. Global longitudinal strain performed but not reported based on interpreter judgement due to suboptimal tracking. The left ventricular internal cavity size was normal in size. There is borderline concentric left ventricular hypertrophy. Left ventricular diastolic parameters are consistent with Grade I diastolic dysfunction (impaired relaxation). Right Ventricle: The right ventricular size is normal. No increase in right ventricular wall thickness. Right ventricular systolic function is normal. Left Atrium: Left atrial size was mildly dilated. Right Atrium: Right atrial size was mildly dilated. Pericardium: There is no evidence of pericardial effusion. Mitral Valve: The mitral valve is normal in structure. Trivial mitral valve regurgitation. MV peak gradient, 5.2 mmHg. The mean mitral valve gradient is 1.0 mmHg. Tricuspid Valve: The tricuspid valve is normal in structure. Tricuspid valve regurgitation is trivial. Aortic Valve: The aortic valve is normal in structure. Aortic valve regurgitation is not visualized. Aortic valve mean gradient measures 3.0 mmHg. Aortic valve peak gradient measures 6.2 mmHg. Aortic valve area, by VTI measures 2.37 cm. Pulmonic Valve: The pulmonic valve was normal in structure. Pulmonic valve regurgitation is not visualized. Aorta: The ascending aorta was not well visualized. IAS/Shunts: No atrial level shunt detected by color flow Doppler. Additional Comments: 3D was performed not requiring image post processing on an independent workstation and was indeterminate.  LEFT VENTRICLE PLAX 2D LVIDd:         4.20 cm   Diastology LVIDs:         3.00 cm   LV e' medial:    5.98 cm/s LV PW:         1.10 cm   LV E/e' medial:  13.7 LV IVS:        1.20 cm    LV e' lateral:   8.38 cm/s LVOT diam:     1.90 cm   LV E/e' lateral: 9.8 LV SV:         56 LV SV Index:   31 LVOT Area:     2.84 cm  RIGHT VENTRICLE RV Basal diam:  3.00 cm RV Mid diam:    2.60 cm RV S prime:     14.10 cm/s TAPSE (M-mode): 2.6 cm LEFT ATRIUM             Index        RIGHT ATRIUM           Index LA diam:        4.00 cm 2.24 cm/m   RA Area:     15.20 cm LA Vol (A2C):   36.7 ml 20.59 ml/m  RA Volume:   35.80 ml  20.08 ml/m LA Vol (A4C):  58.2 ml 32.64 ml/m LA Biplane Vol: 46.6 ml 26.14 ml/m  AORTIC VALVE                    PULMONIC VALVE AV Area (Vmax):    2.34 cm     PV Vmax:          0.75 m/s AV Area (Vmean):   2.31 cm     PV Peak grad:     2.3 mmHg AV Area (VTI):     2.37 cm     PR End Diast Vel: 3.00 msec AV Vmax:           125.00 cm/s  RVOT Peak grad:   1 mmHg AV Vmean:          80.700 cm/s AV VTI:            0.234 m AV Peak Grad:      6.2 mmHg AV Mean Grad:      3.0 mmHg LVOT Vmax:         103.00 cm/s LVOT Vmean:        65.800 cm/s LVOT VTI:          0.196 m LVOT/AV VTI ratio: 0.84  AORTA Ao Root diam: 2.70 cm Ao Asc diam:  3.00 cm MITRAL VALVE                TRICUSPID VALVE MV Area (PHT): 3.37 cm     TR Peak grad:   28.7 mmHg MV Area VTI:   2.14 cm     TR Vmax:        268.00 cm/s MV Peak grad:  5.2 mmHg MV Mean grad:  1.0 mmHg     SHUNTS MV Vmax:       1.14 m/s     Systemic VTI:  0.20 m MV Vmean:      55.7 cm/s    Systemic Diam: 1.90 cm MV Decel Time: 225 msec MV E velocity: 81.80 cm/s MV A velocity: 101.00 cm/s MV E/A ratio:  0.81 MV A Prime:    10.2 cm/s Dwayne Charlett Conroy MD Electronically signed by Antonette Batters MD Signature Date/Time: 09/09/2023/2:40:15 PM    Final     Assessment/Plan Stroke with cognitive deficits.  Recent stroke with primary cognitive deficits affecting clear thinking and verbal communication. Physical mobility generally intact. Currently in a rehabilitation facility for recovery. Cognitive recovery may take up to six months to determine new baseline  functional status. Aspirin and Plavix are prescribed to prevent future strokes, with Plavix to be used for 21 days and then continue with aspirin alone. Atorvastatin is initiated for cholesterol management to prevent future cardiovascular events. Occupational therapy will assess driving safety before discharge. Significant concern for patient's safety awareness and verbal communication deficits without support from speech therapy in the skilled nursing facility. She also has some neglect of surrounding objects. She will need skilled speech therapy during her stay and will have an evaluation. Spoke with Therapy management regarding importance of interventions at this time. Will continue GOC conversations with HCPOA.  - Continue aspirin and Plavix for stroke prevention, with Plavix to be used for 21 days and then continue with aspirin alone. - Continue atorvastatin for cholesterol management to prevent future cardiovascular events. - Arrange follow-up with neurologist. - Initiate physical therapy, speech therapy, and occupational therapy to aid in recovery and ensure safety awareness. - Assess driving safety with occupational therapy before discharge. - Perform MoCA for memory deficit  Hypertension  Blood pressure generally well-controlled with losartan, though one recent reading was slightly elevated at 148/69 mmHg. Monitoring required to ensure consistent control. - Monitor blood pressure closely, aiming for readings under 140/90 mmHg. - Continue losartan 100 mg daily.  Hyperlipidemia Newly initiated on atorvastatin for hyperlipidemia as part of secondary prevention strategy post-stroke. - Continue atorvastatin as prescribed.  Overactive bladder Currently managed with medication, which is effective. Duration of treatment is less than six months. - Continue current medication for overactive bladder.  Allergies Currently using Zyrtec as needed for allergies. Montelukast (Singulair) is also  prescribed but not preferred due to age-related concerns. - Discontinue montelukast due to potential risks in patients over 61 years old. - Continue Zyrtec as needed for allergy symptoms.  Goals of Care Discussion about code status revealed uncertainty regarding a Do Not Resuscitate (DNR) order. She is unsure if she wants chest compressions if found without a pulse. Her son, Cheryl Rosales, is designated as the healthcare proxy, with his daughter Cheryl Rosales as an alternate due to his extensive travel. - Clarify and document code status preferences. - Ensure healthcare proxy information is up to date.  Family/ staff Communication: Nursing, IDT, Called son left VM  Labs/tests ordered: CBC, BMP I spent greater than 45 minutes for the care of this patient in face to face time, chart review, clinical documentation, patient education.

## 2023-09-16 ENCOUNTER — Encounter: Payer: Self-pay | Admitting: Student

## 2023-09-16 ENCOUNTER — Non-Acute Institutional Stay (SKILLED_NURSING_FACILITY): Payer: Self-pay | Admitting: Student

## 2023-09-16 DIAGNOSIS — I6381 Other cerebral infarction due to occlusion or stenosis of small artery: Secondary | ICD-10-CM | POA: Diagnosis not present

## 2023-09-16 DIAGNOSIS — R2689 Other abnormalities of gait and mobility: Secondary | ICD-10-CM

## 2023-09-16 DIAGNOSIS — R413 Other amnesia: Secondary | ICD-10-CM | POA: Diagnosis not present

## 2023-09-16 NOTE — Progress Notes (Addendum)
 Location:  Other [facility].  Nursing Home Room Number: [facility] 119A Place of Service:  SNF [phone]) Provider:  Dr. Valrie Gehrig  PCP: Yehuda Helms, MD  Patient Care Team: Yehuda Helms, MD as PCP - General (Internal Medicine)  Extended Emergency Contact Information Primary Emergency Contact: Carlye Child III Address: [address]          [place], [place] Home Phone: [phone] Relation: Son  Code Status:  DNR Goals of care: Advanced Directive information    09/14/2023    8:26 AM  Advanced Directives  Does Patient Have a Medical Advance Directive? Yes  Type of Advance Directive Out of facility DNR (pink MOST or yellow form)  Does patient want to make changes to medical advance directive? No - Patient declined     Chief Complaint  Patient presents with   Goals of Care    Goals of Care    HPI:  Pt is a 89 y.o. female seen today for Goals of Care.   History of Present Illness The patient, with a history of stroke, presents with cognitive decline. She is accompanied by her son, Keilin Gamboa.  She has experienced a significant decline in cognitive function following a recent stroke. Prior to the stroke, she was navigating well, but now she exhibits disorientation and short-term memory loss. She sometimes remembers her home address but at other times does not. Her MoCA score is 10 out of 30, indicating significant cognitive impairment.  She has not yet started physical therapy but was evaluated recently. She has a history of balance issues, veering to the left when walking, and has been using a walker instead of a cane for safety. She has previously worked with a physical therapist named Jacqlyn Matas as an outpatient.  There is concern about her ability to live independently due to her cognitive and physical impairments. Her son, Harles Lied, is involved in her care and is concerned about her safety if she were to return home or go to [facility], as Josefina Nian also exhibits some cognitive issues.  Her family history includes a paternal grandmother who had a stroke and subsequently developed dementia. [facility] is actively involved in her care and is advocating for her to receive adequate therapy to prevent further decline.  No issues with eyesight or vision have been reported, but balance issues have been noted.   Past Medical History:  Diagnosis Date   Arthritis    knees, fingers, thumbs   Cancer (HCC)    skin   H/O blood clots 2016   bilateral lungs - attributed to Evista use   Hypertension    Neurocardiogenic syncope    Past Surgical History:  Procedure Laterality Date   APPENDECTOMY     BREAST BIOPSY Left    neg   CATARACT EXTRACTION W/PHACO Left 02/04/2016   Procedure: CATARACT EXTRACTION PHACO AND INTRAOCULAR LENS PLACEMENT (IOC);  Surgeon: Annell Kidney, MD;  Location: [facility];  Service: Ophthalmology;  Laterality: Left;  PT WOULD LIKE EARLY AM   CATARACT EXTRACTION W/PHACO Right 02/25/2016   Procedure: CATARACT EXTRACTION PHACO AND INTRAOCULAR LENS PLACEMENT (IOC);  Surgeon: Annell Kidney, MD;  Location: [facility];  Service: Ophthalmology;  Laterality: Right;  PT PREFERS EARLY AM   COLONOSCOPY     TONSILLECTOMY      Allergies  Allergen Reactions   Mobic  [Meloxicam ] Shortness Of Breath    Per pt report   Codeine Swelling   Hydrocodone Nausea And  Vomiting   Oxycodone Nausea And Vomiting   Raloxifene Other (See Comments)    Pt reports MD told her possibly gave her blood clots  Pt reports MD told her possibly gave her blood clots  Pt reports MD told her possibly gave her blood clots   Meperidine Swelling and Dermatitis    Outpatient Encounter Medications as of 09/16/2023  Medication Sig   acetaminophen  (ARTHRITIS PAIN) 650 MG CR tablet Take 650 mg by mouth every 8 (eight) hours as needed for pain.   aspirin  EC 81 MG tablet Take 1 tablet (81 mg total) by mouth daily. Swallow  whole.   atorvastatin  (LIPITOR) 40 MG tablet Take 1 tablet (40 mg total) by mouth daily.   cetirizine (ZYRTEC) 10 MG tablet Take 10 mg by mouth daily as needed for allergies.   clopidogrel  (PLAVIX ) 75 MG tablet Take 1 tablet (75 mg total) by mouth daily for 21 days.   losartan (COZAAR) 100 MG tablet Take 100 mg by mouth daily.   solifenacin (VESICARE) 10 MG tablet Take 10 mg by mouth daily.   No facility-administered encounter medications on file as of 09/16/2023.    Review of Systems  Immunization History  Administered Date(s) Administered   Influenza-Unspecified 01/12/2013, 02/05/2017, 02/01/2018   Moderna Sars-Covid-2 Vaccination 05/07/2019, 06/04/2019   Respiratory Syncytial Virus Vaccine,Recomb Aduvanted(Arexvy) 02/24/2022   Tdap 01/03/2015   Zoster Recombinant(Shingrix) 11/25/2017, 02/17/2018   Pertinent  Health Maintenance Due  Topic Date Due   DEXA SCAN  Never done   INFLUENZA VACCINE  11/18/2023      11/09/2019   11:16 AM 05/02/2022   10:17 AM  Fall Risk  (RETIRED) Patient Fall Risk Level Low fall risk Moderate fall risk   Functional Status Survey:    Vitals:   09/16/23 1257 09/16/23 1303  BP: (!) 163/81 (!) 158/78  Pulse: (!) 50   Resp: 20   Temp: (!) 97.1 F (36.2 C)   SpO2: 94%   Weight: 166 lb 6.4 oz (75.5 kg)   Height: 5\' 4"  (1.626 m)    Body mass index is 28.56 kg/m. Physical Exam Constitutional:      Appearance: Normal appearance.  Neurological:     Mental Status: She is alert.     Labs reviewed: Recent Labs    09/08/23 1702 09/09/23 0427  NA 140 140  K 4.6 4.0  CL 107 109  CO2 22 24  GLUCOSE 118* 147*  BUN 30* 40*  CREATININE 1.69* 1.53*  CALCIUM  9.5 9.0   Recent Labs    09/08/23 1702  AST 20  ALT 14  ALKPHOS 42  BILITOT 1.2  PROT 7.2  ALBUMIN 4.3   Recent Labs    09/08/23 1702 09/09/23 0427  WBC 15.1* 12.7*  NEUTROABS 12.7*  --   HGB 14.2 12.7  HCT 44.7 37.8  MCV 94.7 92.9  PLT 294 255   No results found for:  "TSH" Lab Results  Component Value Date   HGBA1C 5.5 09/09/2023   Lab Results  Component Value Date   CHOL 164 09/09/2023   HDL 46 09/09/2023   LDLCALC 107 (H) 09/09/2023   TRIG 57 09/09/2023   CHOLHDL 3.6 09/09/2023    Significant Diagnostic Results in last 30 days:  CT ANGIO HEAD NECK W WO CM Result Date: 09/09/2023 CLINICAL DATA:  Neuro deficit, acute, stroke suspected EXAM: CT ANGIOGRAPHY HEAD AND NECK WITH AND WITHOUT CONTRAST TECHNIQUE: Multidetector CT imaging of the head and neck was performed using the standard protocol  during bolus administration of intravenous contrast. Multiplanar CT image reconstructions and MIPs were obtained to evaluate the vascular anatomy. Carotid stenosis measurements (when applicable) are obtained utilizing NASCET criteria, using the distal internal carotid diameter as the denominator. RADIATION DOSE REDUCTION: This exam was performed according to the departmental dose-optimization program which includes automated exposure control, adjustment of the mA and/or kV according to patient size and/or use of iterative reconstruction technique. CONTRAST:  50mL OMNIPAQUE  IOHEXOL  350 MG/ML SOLN COMPARISON:  CT head May 22, 25. FINDINGS: CT HEAD FINDINGS Brain: No evidence of acute infarction, hemorrhage, hydrocephalus, extra-axial collection or mass lesion/mass effect. Remote right basal ganglia lacunar infarct. Patchy white matter hypodensities, compatible with chronic microvascular ischemic disease. Vascular: See below. Skull: No acute fracture. Sinuses/Orbits: Clear sinuses.  No acute orbital findings. Other: No mastoid effusions. Review of the MIP images confirms the above findings CTA NECK FINDINGS Aortic arch: Great vessel origins appear patent. Right carotid system: Atherosclerosis at the carotid bifurcation without greater than 50% stenosis. Left carotid system: Ulcerated atherosclerosis at the carotid bifurcation without greater than 50% stenosis. Approximately  outpouching along the medial aspect of the carotid bifurcation which includes the origin of the EAC, compatible with aneurysm. Vertebral arteries: Codominant. No evidence of dissection, stenosis (50% or greater), or occlusion. Skeleton: No acute fracture on limited assessment. Other neck: No acute abnormality on limited assessment. Upper chest: Visualized lung apices are clear. Review of the MIP images confirms the above findings CTA HEAD FINDINGS Anterior circulation: Bilateral intracranial ICAs,, MCAs, and ACAs are patent without proximal hemodynamically significant stenosis. Approximately 3 mm anteriorly directed outpouching rising from the left carotid terminus, compatible with aneurysm (series 11, image 104). Posterior circulation: Bilateral intradural vertebral arteries, basilar artery and bilateral post cerebral arteries are patent. Moderate left distal P2 PCA stenosis. Right fetal type PCA, anatomic variant. Venous sinuses: As permitted by contrast timing, patent. Review of the MIP images confirms the above findings IMPRESSION: 1. No large vessel occlusion. 2. Moderate left distal P2 PCA stenosis. 3. 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 4. Ulcerated atherosclerosis at the left carotid bifurcation with approximately 3 mm aneurysm at the ECA origin. Electronically Signed   By: Stevenson Elbe M.D.   On: 09/09/2023 19:12   ECHOCARDIOGRAM COMPLETE Result Date: 09/09/2023    ECHOCARDIOGRAM REPORT   Patient Name:   GAE BIHL Date of Exam: 09/09/2023 Medical Rec #:  [id number]         Height:       64.5 in Accession #:    [id number]        Weight:       158.7 lb Date of Birth:  Oct 15, 1934         BSA:          1.783 m Patient Age:    89 years          BP:           166/120 mmHg Patient Gender: F                 HR:           66 bpm. Exam Location:  [facility] Procedure: 2D Echo, Cardiac Doppler and Color Doppler (Both Spectral and Color            Flow Doppler were utilized during procedure).  Indications:     Stroke I63.9  History:         Patient has prior history of Echocardiogram examinations. Risk  Factors:Hypertension.  Sonographer:     Kathaleen Pale Roar Referring Phys:  [id number] Annette Killings AMPONSAH Diagnosing Phys: Antonette Batters MD IMPRESSIONS  1. Left ventricular ejection fraction, by estimation, is 55 to 60%. The left ventricle has normal function. The left ventricle has no regional wall motion abnormalities. Left ventricular diastolic parameters are consistent with Grade I diastolic dysfunction (impaired relaxation).  2. Right ventricular systolic function is normal. The right ventricular size is normal.  3. Left atrial size was mildly dilated.  4. Right atrial size was mildly dilated.  5. The mitral valve is normal in structure. Trivial mitral valve regurgitation.  6. The aortic valve is normal in structure. Aortic valve regurgitation is not visualized. FINDINGS  Left Ventricle: Left ventricular ejection fraction, by estimation, is 55 to 60%. The left ventricle has normal function. The left ventricle has no regional wall motion abnormalities. Strain was performed and the global longitudinal strain is indeterminate. Global longitudinal strain performed but not reported based on interpreter judgement due to suboptimal tracking. The left ventricular internal cavity size was normal in size. There is borderline concentric left ventricular hypertrophy. Left ventricular diastolic parameters are consistent with Grade I diastolic dysfunction (impaired relaxation). Right Ventricle: The right ventricular size is normal. No increase in right ventricular wall thickness. Right ventricular systolic function is normal. Left Atrium: Left atrial size was mildly dilated. Right Atrium: Right atrial size was mildly dilated. Pericardium: There is no evidence of pericardial effusion. Mitral Valve: The mitral valve is normal in structure. Trivial mitral valve regurgitation. MV peak gradient, 5.2 mmHg. The  mean mitral valve gradient is 1.0 mmHg. Tricuspid Valve: The tricuspid valve is normal in structure. Tricuspid valve regurgitation is trivial. Aortic Valve: The aortic valve is normal in structure. Aortic valve regurgitation is not visualized. Aortic valve mean gradient measures 3.0 mmHg. Aortic valve peak gradient measures 6.2 mmHg. Aortic valve area, by VTI measures 2.37 cm. Pulmonic Valve: The pulmonic valve was normal in structure. Pulmonic valve regurgitation is not visualized. Aorta: The ascending aorta was not well visualized. IAS/Shunts: No atrial level shunt detected by color flow Doppler. Additional Comments: 3D was performed not requiring image post processing on an independent workstation and was indeterminate.  LEFT VENTRICLE PLAX 2D LVIDd:         4.20 cm   Diastology LVIDs:         3.00 cm   LV e' medial:    5.98 cm/s LV PW:         1.10 cm   LV E/e' medial:  13.7 LV IVS:        1.20 cm   LV e' lateral:   8.38 cm/s LVOT diam:     1.90 cm   LV E/e' lateral: 9.8 LV SV:         56 LV SV Index:   31 LVOT Area:     2.84 cm  RIGHT VENTRICLE RV Basal diam:  3.00 cm RV Mid diam:    2.60 cm RV S prime:     14.10 cm/s TAPSE (M-mode): 2.6 cm LEFT ATRIUM             Index        RIGHT ATRIUM           Index LA diam:        4.00 cm 2.24 cm/m   RA Area:     15.20 cm LA Vol (A2C):   36.7 ml 20.59 ml/m  RA Volume:   35.80 ml  20.08 ml/m LA Vol (A4C):   58.2 ml 32.64 ml/m LA Biplane Vol: 46.6 ml 26.14 ml/m  AORTIC VALVE                    PULMONIC VALVE AV Area (Vmax):    2.34 cm     PV Vmax:          0.75 m/s AV Area (Vmean):   2.31 cm     PV Peak grad:     2.3 mmHg AV Area (VTI):     2.37 cm     PR End Diast Vel: 3.00 msec AV Vmax:           125.00 cm/s  RVOT Peak grad:   1 mmHg AV Vmean:          80.700 cm/s AV VTI:            0.234 m AV Peak Grad:      6.2 mmHg AV Mean Grad:      3.0 mmHg LVOT Vmax:         103.00 cm/s LVOT Vmean:        65.800 cm/s LVOT VTI:          0.196 m LVOT/AV VTI ratio: 0.84   AORTA Ao Root diam: 2.70 cm Ao Asc diam:  3.00 cm MITRAL VALVE                TRICUSPID VALVE MV Area (PHT): 3.37 cm     TR Peak grad:   28.7 mmHg MV Area VTI:   2.14 cm     TR Vmax:        268.00 cm/s MV Peak grad:  5.2 mmHg MV Mean grad:  1.0 mmHg     SHUNTS MV Vmax:       1.14 m/s     Systemic VTI:  0.20 m MV Vmean:      55.7 cm/s    Systemic Diam: 1.90 cm MV Decel Time: 225 msec MV E velocity: 81.80 cm/s MV A velocity: 101.00 cm/s MV E/A ratio:  0.81 MV A Prime:    10.2 cm/s Antonette Batters MD Electronically signed by Antonette Batters MD Signature Date/Time: 09/09/2023/2:40:15 PM    Final    MR BRAIN WO CONTRAST Result Date: 09/08/2023 CLINICAL DATA:  Initial evaluation for acute mental status change, unknown cause. EXAM: MRI HEAD WITHOUT CONTRAST TECHNIQUE: Multiplanar, multiecho pulse sequences of the brain and surrounding structures were obtained without intravenous contrast. COMPARISON:  CT from earlier the same day. FINDINGS: Brain: Cerebral volume within normal limits for age. Patchy T2/FLAIR hyperintensity involving the supratentorial cerebral white matter, consistent with chronic small vessel ischemic disease, moderately advanced in nature. Restricted diffusion involving the left thalamus with extension towards the mesial left temporal lobe, consistent with an acute ischemic infarct (series 5, image 31). No associated hemorrhage or significant mass effect. No other evidence for acute or subacute ischemia. No acute intracranial hemorrhage. Multiple scattered chronic micro hemorrhages noted, likely hypertensive in nature. No mass lesion, midline shift or mass effect. No hydrocephalus or extra-axial fluid collection. Pituitary gland within normal limits. Vascular: Major intracranial vascular flow voids are maintained. Skull and upper cervical spine: Craniocervical junction within normal limits. Bone marrow signal intensity normal. No scalp soft tissue abnormality. Sinuses/Orbits: Prior bilateral  ocular lens replacement. Paranasal sinuses are largely clear. No significant mastoid effusion. Other: None. IMPRESSION: 1. Acute ischemic nonhemorrhagic infarct involving the left thalamus as above. 2. Underlying moderately advanced  chronic microvascular ischemic disease. Electronically Signed   By: Virgia Griffins M.D.   On: 09/08/2023 23:05   CT HEAD WO CONTRAST Result Date: 09/08/2023 CLINICAL DATA:  Aphasia and right-sided weakness EXAM: CT HEAD WITHOUT CONTRAST TECHNIQUE: Contiguous axial images were obtained from the base of the skull through the vertex without intravenous contrast. RADIATION DOSE REDUCTION: This exam was performed according to the departmental dose-optimization program which includes automated exposure control, adjustment of the mA and/or kV according to patient size and/or use of iterative reconstruction technique. COMPARISON:  08/26/2016 FINDINGS: Brain: No evidence of acute infarction, hemorrhage, hydrocephalus, extra-axial collection or mass lesion/mass effect. Mild atrophic changes and chronic white matter ischemic changes are noted. Vascular: No hyperdense vessel or unexpected calcification. Skull: Normal. Negative for fracture or focal lesion. Sinuses/Orbits: No acute finding. Other: None. IMPRESSION: Chronic atrophic and ischemic changes without acute abnormality. Electronically Signed   By: Violeta Grey M.D.   On: 09/08/2023 19:19   MM 3D SCREENING MAMMOGRAM BILATERAL BREAST Result Date: 08/31/2023 CLINICAL DATA:  Screening. EXAM: DIGITAL SCREENING BILATERAL MAMMOGRAM WITH TOMOSYNTHESIS AND CAD TECHNIQUE: Bilateral screening digital craniocaudal and mediolateral oblique mammograms were obtained. Bilateral screening digital breast tomosynthesis was performed. The images were evaluated with computer-aided detection. COMPARISON:  Previous exam(s). ACR Breast Density Category c: The breasts are heterogeneously dense, which may obscure small masses. FINDINGS: There are no  findings suspicious for malignancy. IMPRESSION: No mammographic evidence of malignancy. A result letter of this screening mammogram will be mailed directly to the patient. RECOMMENDATION: Screening mammogram in one year. (Code:[id number]) BI-RADS CATEGORY  1: Negative. Electronically Signed   By: Sundra Engel M.D.   On: 08/31/2023 11:47    Assessment/Plan Cognitive impairment post-stroke Cognitive impairment following a recent stroke, characterized by disorientation, short-term memory loss, and a MoCA score of 10, indicating significant cognitive deficits. The condition is in the subacute phase, with potential for improvement over the next month with regular therapy. Her decision-making capacity is impaired but not deemed incompetent. - Continue occupational and speech therapy to address cognitive deficits. - Write a capacity letter to facilitate financial decision-making by family. - Schedule a neurology appointment with Dr. Mason Sole for further evaluation. - Re-evaluate cognitive status and capacity in one month, including a repeat MoCA.  Balance issues Balance issues with a tendency to veer to the left, possibly exacerbated by the recent stroke. She uses a walker for safety due to balance concerns. - Continue physical therapy to address balance issues and improve ambulation. - Use a walker for mobility to ensure safety. - Assess vision to rule out any contributing factors to balance issues.  Family/ staff Communication: nursing, Son, Child psychotherapist, OT, [place] Social Worker  Labs/tests ordered:  none   I spent greater than 20  minutes for the care of this patient in face to face time, chart review, clinical documentation, patient education. I spent an additional 16 minutes discussing goals of care and advanced care planning.

## 2023-09-17 ENCOUNTER — Encounter: Payer: Self-pay | Admitting: Student

## 2023-09-19 NOTE — Telephone Encounter (Signed)
Message routed to Dr.Beamer. 

## 2023-10-11 ENCOUNTER — Non-Acute Institutional Stay (SKILLED_NURSING_FACILITY): Admitting: Nurse Practitioner

## 2023-10-11 ENCOUNTER — Encounter: Payer: Self-pay | Admitting: Nurse Practitioner

## 2023-10-11 DIAGNOSIS — I10 Essential (primary) hypertension: Secondary | ICD-10-CM

## 2023-10-11 DIAGNOSIS — R413 Other amnesia: Secondary | ICD-10-CM | POA: Diagnosis not present

## 2023-10-11 DIAGNOSIS — R2689 Other abnormalities of gait and mobility: Secondary | ICD-10-CM | POA: Diagnosis not present

## 2023-10-11 DIAGNOSIS — Z8673 Personal history of transient ischemic attack (TIA), and cerebral infarction without residual deficits: Secondary | ICD-10-CM | POA: Diagnosis not present

## 2023-10-11 DIAGNOSIS — N3281 Overactive bladder: Secondary | ICD-10-CM | POA: Diagnosis not present

## 2023-10-11 MED ORDER — CLOPIDOGREL BISULFATE 75 MG PO TABS: 75.0000 mg | ORAL_TABLET | Freq: Every day | ORAL | 0 refills | Status: AC

## 2023-10-11 NOTE — Progress Notes (Signed)
 Location:  Other Nursing Home Room Number: [facility] 119A Place of Service:  SNF [phone])  Sparks, Reyes BIRCH, MD  Patient Care Team: Auston Reyes BIRCH, MD as PCP - General (Internal Medicine)  Extended Emergency Contact Information Primary Emergency Contact: Fiebig,Lewis III Address: [address]          [place], [place] Home Phone: [phone] Relation: Son  Goals of care: Advanced Directive information    10/11/2023    1:38 PM  Advanced Directives  Does Patient Have a Medical Advance Directive? Yes  Type of Advance Directive Out of facility DNR (pink MOST or yellow form)  Does patient want to make changes to medical advance directive? No - Patient declined     Chief Complaint  Patient presents with   Discharge Note    HPI:  Pt is a 89 y.o. female seen today for discharge home. She had recent CVA and at [facility] for therapy due to weakness.  Staff reports she is not ready to go home due to balance issues and confusion.  Does not use walker due to forgetfulness but has been recommended to use. Very unsteady without walker but able to navigate well with it.  She had home assessment which they reported she can not stay home alone- plan is for son plans to come stay with her during the transition and she has home care set up to help with medication management.  She has been followed by neurology for cognitive function.  She has follow up with her PCP on 10/12/2023.  She knows her birthday but Reports she is 89.  Denies any pain.  No GERD No chest pains or shortness of breath    Past Medical History:  Diagnosis Date   Arthritis    knees, fingers, thumbs   Cancer (HCC)    skin   H/O blood clots 2016   bilateral lungs - attributed to Evista use   Hypertension    Neurocardiogenic syncope    Past Surgical History:  Procedure Laterality Date   APPENDECTOMY     BREAST BIOPSY Left    neg   CATARACT EXTRACTION W/PHACO Left  02/04/2016   Procedure: CATARACT EXTRACTION PHACO AND INTRAOCULAR LENS PLACEMENT (IOC);  Surgeon: Dene Etienne, MD;  Location: [facility];  Service: Ophthalmology;  Laterality: Left;  PT WOULD LIKE EARLY AM   CATARACT EXTRACTION W/PHACO Right 02/25/2016   Procedure: CATARACT EXTRACTION PHACO AND INTRAOCULAR LENS PLACEMENT (IOC);  Surgeon: Dene Etienne, MD;  Location: [facility];  Service: Ophthalmology;  Laterality: Right;  PT PREFERS EARLY AM   COLONOSCOPY     TONSILLECTOMY      Allergies  Allergen Reactions   Mobic  [Meloxicam ] Shortness Of Breath    Per pt report   Codeine Swelling   Hydrocodone Nausea And Vomiting   Oxycodone Nausea And Vomiting   Raloxifene Other (See Comments)    Pt reports MD told her possibly gave her blood clots  Pt reports MD told her possibly gave her blood clots  Pt reports MD told her possibly gave her blood clots   Meperidine Swelling and Dermatitis    Outpatient Encounter Medications as of 10/11/2023  Medication Sig   acetaminophen  (ARTHRITIS PAIN) 650 MG CR tablet Take 650 mg by mouth every 8 (eight) hours as needed for pain.   aspirin  EC 81 MG tablet Take 1 tablet (81 mg total) by mouth daily. Swallow whole.   atorvastatin  (LIPITOR) 40 MG  tablet Take 1 tablet (40 mg total) by mouth daily.   cetirizine (ZYRTEC) 10 MG tablet Take 10 mg by mouth daily as needed for allergies.   clopidogrel  (PLAVIX ) 75 MG tablet Take 1 tablet (75 mg total) by mouth daily.   hydrALAZINE (APRESOLINE) 25 MG tablet Take 25 mg by mouth 3 (three) times daily.   losartan (COZAAR) 100 MG tablet Take 100 mg by mouth daily.   solifenacin (VESICARE) 10 MG tablet Take 10 mg by mouth daily.   No facility-administered encounter medications on file as of 10/11/2023.    Review of Systems  Constitutional:  Negative for activity change, appetite change, fatigue and unexpected weight change.  HENT:  Negative for congestion and hearing loss.   Eyes:  Negative.   Respiratory:  Negative for cough and shortness of breath.   Cardiovascular:  Negative for chest pain, palpitations and leg swelling.  Gastrointestinal:  Negative for abdominal pain, constipation and diarrhea.  Genitourinary:  Negative for difficulty urinating and dysuria.  Musculoskeletal:  Negative for arthralgias and myalgias.  Skin:  Negative for color change and wound.  Neurological:  Negative for dizziness and weakness.  Psychiatric/Behavioral:  Positive for confusion. Negative for agitation and behavioral problems.     Immunization History  Administered Date(s) Administered   Influenza-Unspecified 01/12/2013, 02/05/2017, 02/01/2018   Moderna Sars-Covid-2 Vaccination 05/07/2019, 06/04/2019   PNEUMOCOCCAL CONJUGATE-20 09/20/2023   Respiratory Syncytial Virus Vaccine,Recomb Aduvanted(Arexvy) 02/24/2022   Tdap 01/03/2015   Unspecified SARS-COV-2 Vaccination 09/20/2023   Zoster Recombinant(Shingrix) 11/25/2017, 02/17/2018   Pertinent  Health Maintenance Due  Topic Date Due   DEXA SCAN  Never done   INFLUENZA VACCINE  11/18/2023      11/09/2019   11:16 AM 05/02/2022   10:17 AM  Fall Risk  (RETIRED) Patient Fall Risk Level Low fall risk  Moderate fall risk      Data saved with a previous flowsheet row definition   Functional Status Survey:    Vitals:   10/11/23 1334  BP: 139/78  Pulse: 78  Resp: 18  Temp: 97.7 F (36.5 C)  SpO2: 96%  Weight: 166 lb 9.6 oz (75.6 kg)  Height: 5' 4 (1.626 m)   Body mass index is 28.6 kg/m. Physical Exam Constitutional:      General: She is not in acute distress.    Appearance: She is well-developed. She is not diaphoretic.  HENT:     Head: Normocephalic and atraumatic.     Mouth/Throat:     Pharynx: No oropharyngeal exudate.   Eyes:     Conjunctiva/sclera: Conjunctivae normal.     Pupils: Pupils are equal, round, and reactive to light.    Cardiovascular:     Rate and Rhythm: Normal rate and regular rhythm.      Heart sounds: Normal heart sounds.  Pulmonary:     Effort: Pulmonary effort is normal.     Breath sounds: Normal breath sounds.  Abdominal:     General: Bowel sounds are normal.     Palpations: Abdomen is soft.   Musculoskeletal:     Cervical back: Normal range of motion and neck supple.     Right lower leg: No edema.     Left lower leg: No edema.   Skin:    General: Skin is warm and dry.   Neurological:     Mental Status: She is alert and oriented to person, place, and time.     Motor: Weakness present.     Gait: Gait abnormal.  Psychiatric:        Mood and Affect: Mood normal.     Labs reviewed: Recent Labs    09/08/23 1702 09/09/23 0427  NA 140 140  K 4.6 4.0  CL 107 109  CO2 22 24  GLUCOSE 118* 147*  BUN 30* 40*  CREATININE 1.69* 1.53*  CALCIUM  9.5 9.0   Recent Labs    09/08/23 1702  AST 20  ALT 14  ALKPHOS 42  BILITOT 1.2  PROT 7.2  ALBUMIN 4.3   Recent Labs    09/08/23 1702 09/09/23 0427  WBC 15.1* 12.7*  NEUTROABS 12.7*  --   HGB 14.2 12.7  HCT 44.7 37.8  MCV 94.7 92.9  PLT 294 255   No results found for: TSH Lab Results  Component Value Date   HGBA1C 5.5 09/09/2023   Lab Results  Component Value Date   CHOL 164 09/09/2023   HDL 46 09/09/2023   LDLCALC 107 (H) 09/09/2023   TRIG 57 09/09/2023   CHOLHDL 3.6 09/09/2023    Significant Diagnostic Results in last 30 days:  No results found.  Assessment/Plan 1. History of stroke (Primary) Left with generalized weakness and progressive memory issues.  Followed up with neurology recommended plavix  with ASA -continues on lipitor for reduction of LDL - clopidogrel  (PLAVIX ) 75 MG tablet; Take 1 tablet (75 mg total) by mouth daily.  Dispense: 30 tablet; Refill: 0  2. Memory deficit Ongoing, OT assessment does not recommend she stays home alone. She plans to have home care come to help with medication. Her son plans to stay with her a while.  She lives at [facility] and  independent living- SW has been notified and will follow up to ensure a safe transition   3. Hypertension, unspecified type -Blood pressure well controlled, goal bp <140/90 Continue current medications and dietary modifications follow metabolic panel  4. Overactive bladder Stable on vesicare  pt is stable for discharge home with 24/7 care will need PT/OT/ST and SW, no DME needed  has follow up with PCP in am.    Stoney Karczewski K. Caro BODILY [facility] [phone]

## 2024-02-27 NOTE — Progress Notes (Signed)
 Established Patient Visit   Chief Complaint: Chief Complaint  Patient presents with  . Acute Visit    Acute Visit- Chest pain for the past month a few days a week   Date of Service: 02/27/2024 Date of Birth: [birth year] PCP: Auston Reyes BIRCH, MD  History of Present Illness: [name] is a 89 y.o.female patient with a history of  1.  Recurrent neurocardiogenic syncope              2.  Essential hypertension             3.  Hyperlipidemia             4.  CKD stage III   The patient was previously followed by Dr. Hester.  She has a history of neurocardiogenic syncope, dating back to 1991, occurring 2-3 times per year.  On 05/02/2022 the patient experienced a syncopal episode.  She was in her kitchen at [facility] and then awoke on the floor.  Resident EMT found her and called EMS.  Patient was brought to [facility] emergency room where workup was unremarkable.  ECG revealed sinus rhythm.  Troponin was negative x 2.     7-day Holter monitor 05/08/2022 - 05/19/2022 revealed predominant sinus rhythm with mean heart rate of 63 bpm with sinus heart rate range 31 to 105 bpm. Sinus bradycardia was observed. 2 sinus pauses were observed the longest lasting 4.1 seconds. Occasional premature atrial contractions and premature ventricular contractions were present. There were no diary entries.  Of note, the patient was on low-dose metoprolol  tartrate while wearing this monitor.   At a previous office visit, due to patient's history of syncope for years, and sinus pauses noted on Holter monitor, the patient was instructed on how to  titrate dose of metoprolol  down to discontinue altogether. On the final day of metoprolol  on 06/23/2022, the patient reports that she was sitting in her chair and lost consciousness without preceding warning symptoms. EMS evaluated her at which time rhythm strip showed sinus rhythm at a rate of 64 bpm. Due to feeling well, she did not go to the ED. The repeat Holter monitor was placed the  next day to reflect cardiac activity off of metoprolol . 7-day Holter monitor 06/24/2022-/14/2024 revealed predominant sinus rhythm with mean heart rate of 69 bpm, sinus heart rate range 38 to 112 bpm. Sinus bradycardia was observed. Occasional premature atrial contractions (less than 1%), and infrequent premature ventricular contractions were present. Occasional brief runs of SVT were observed the longest lasting 11 seconds. There were no diary entries.   The patient returns today for follow-up, reports doing pretty good she denies exertional chest pain or shortness of breath.  She does report intermittent episodes of midsternal chest discomfort, described as just a pain, which occurs with and without exertion, comes and goes, which are brief in nature, lasting 1 to 2 minutes, occurring 1-2 times per month, times several weeks.  She denies palpitations or heart racing.  She denies peripheral edema.  The patient does water aerobics when the pool is open, and is yoga twice a week, without any chest pain or shortness of breath.  ECG reveals normal sinus rhythm at 65 bpm.  The patient has essential hypertension, blood pressure well-controlled on current BP medications, tolerated well without significant side effects.  The patient follows a low-sodium, no added salt diet.   Past Medical and Surgical History  Past Medical History Past Medical History:  Diagnosis Date  . Arthritis   .  Carpal tunnel syndrome, bilateral   . CVA (cerebral vascular accident) (CMS/HHS-HCC) 09/08/2023  . Family history of breast cancer   . Family history of thyroid  cancer   . Fibrocystic breast disease   . History of blood clots 2016   bilateral lungs - attributed to Evista use  . History of torn meniscus of left knee 11/18/2006   History of left knee trauma with torn meniscus.  . Hyperlipidemia   . Hypertension   . Neurocardiogenic syncope   . Osteoporosis, post-menopausal   . Poison ivy    History of recurrent  poison ivy  . Positive PPD    with abnormal chest xray  . Skin cancer   . Stroke (CMS/HHS-HCC) 09/08/2023    Past Surgical History She has a past surgical history that includes Appendectomy; Tonsillectomy; CATARACT EXTRACTION PHACO AND INTRAOCULAR LENS PLACEMENT (IOC) (Left, 02/04/2016); CATARACT EXTRACTION PHACO AND INTRAOCULAR LENS PLACEMENT (IOC); (Right, 02/25/2016); Colonoscopy; and Breast excisional biopsy (Left).   Medications and Allergies  Current Medications  Current Outpatient Medications  Medication Sig Dispense Refill  . acetaminophen  (TYLENOL ) 650 MG ER tablet Take 650 mg by mouth every 8 (eight) hours as needed for Pain    . amLODIPine  (NORVASC ) 2.5 MG tablet Take 1 tablet (2.5 mg total) by mouth once daily 90 tablet 1  . aspirin  81 MG EC tablet Take 81 mg by mouth once daily    . atorvastatin  (LIPITOR) 40 MG tablet Take 1 tablet (40 mg total) by mouth once daily 90 tablet 3  . azithromycin (ZITHROMAX) 250 MG tablet Take 250 mg by mouth once daily Monday, Wednesday, and Friday    . clopidogreL  (PLAVIX ) 75 mg tablet Take 1 tablet (75 mg total) by mouth once daily 90 tablet 3  . losartan  (COZAAR ) 100 MG tablet TAKE ONE TABLET EVERY DAY 90 tablet 3  . solifenacin (VESICARE) 10 MG tablet TAKE 1 TABLET BY MOUTH DAILY 90 tablet 1  . triamcinolone 0.5 % cream Apply topically 2 (two) times daily Apply 2 x per day (up to 7-10 days) 30 g 2  . cetirizine (ZYRTEC) 10 MG tablet Take 10 mg by mouth once daily (Patient not taking: Reported on 02/27/2024)     No current facility-administered medications for this visit.    Allergies: Meloxicam , Codeine, Demerol [meperidine], Hydrocodone, Oxycodone, and Raloxifene  Social and Family History  Social History  reports that she has never smoked. She has never used smokeless tobacco. She reports that she does not drink alcohol and does not use drugs.  Family History Family History  Problem Relation Name Age of Onset  . Breast cancer  Mother    . Thyroid  cancer Mother    . Heart disease Father    . Breast cancer Paternal Aunt      Review of Systems   Review of Systems: The patient reports occasional chest pain, with mild shortness of breath, without orthopnea, paroxysmal nocturnal dyspnea, pedal edema, palpitations, heart racing, presyncope, syncope, with chronic productive cough. Review of 8 Systems is negative except as described above.  Physical Examination   Vitals:BP 108/70   Pulse 65   Ht 162.6 cm (5' 4)   Wt 72.6 kg (160 lb)   SpO2 97%   BMI 27.46 kg/m  Ht:162.6 cm (5' 4) Wt:72.6 kg (160 lb) ADJ:Anib surface area is 1.81 meters squared. Body mass index is 27.46 kg/m.  General: Alert and oriented. Well-appearing. No acute distress. HEENT: Pupils equally reactive to light and accomodation    Neck:  no JVD Lungs: Normal effort of breathing; clear to auscultation bilaterally; no wheezes, rales, rhonchi Heart: Regular rate and rhythm. No murmur, rub, or gallop Abdomen: nondistended Extremities: no cyanosis, clubbing, or edema Peripheral Pulses: 2+ radial  Skin: Warm, dry, no diaphoresis   Assessment   89 y.o. female with  1. HTN, goal below 140/80   2. Neurocardiogenic syncope   3. Recurrent syncope   4. Mixed hyperlipidemia   5. Cerebrovascular accident (CVA) due to thrombosis of other cerebral artery (CMS/HHS-HCC)   6. Left thalamic infarction (CMS/HHS-HCC)   7. Stage 3a chronic kidney disease (CMS-HCC)    89 year old female with history of neurocardiogenic syncope, with recent syncopal episode 05/02/2022 with unremarkable workup at [facility] ED with negative troponin and normal ECG.  The patient has essential hypertension, blood pressure well-controlled on current BP medications. Holter monitor revealed predominant sinus rhythm with mean heart rate of 63 bpm with sinus heart rate from 31-105 bpm. There were 2 sinus pauses, the longest lasting 4.1 seconds. She was asymptomatic. Of note, the patient was  on metoprolol  while wearing this monitor. She reports about 2-3 episodes of syncope per year since 1991. She was instructed on titrating metoprolol  down to discontinue. On the final day of taking metoprolol , she had recurrent brief syncope at rest with no preceding symptoms. EMS evaluated patient, at which time ECG showed sinus rhythm at a rate of 64 bpm. Repeat Holter monitor was placed after stopping metoprolol , and revealed predominant sinus rhythm with mean heart rate of 69 bpm with no recurrent sinus pauses. She denies recurrent presyncope or syncope.  Patient returns today, with several week history of nonexertional, brief chest pain, atypical in nature, infrequent, with normal ECG.   Plan   Continue current medications Counseled patient about low-sodium diet DASH diet print instructions given to the patient Defer cardiac diagnostics at this time 5.   Return to clinic in 3 months  Orders Placed This Encounter  Procedures  . ECG 12-lead    Return in about 3 months (around 05/29/2024).  MARSA DOOMS, MD PhD [facility]

## 2024-03-12 ENCOUNTER — Observation Stay
Admission: EM | Admit: 2024-03-12 | Discharge: 2024-03-13 | Disposition: A | Discharge status: Home / Self Care | Attending: Internal Medicine | Admitting: Internal Medicine

## 2024-03-12 ENCOUNTER — Emergency Department

## 2024-03-12 DIAGNOSIS — R55 Syncope and collapse: Principal | ICD-10-CM | POA: Diagnosis present

## 2024-03-12 DIAGNOSIS — Z85828 Personal history of other malignant neoplasm of skin: Secondary | ICD-10-CM | POA: Insufficient documentation

## 2024-03-12 DIAGNOSIS — Z7901 Long term (current) use of anticoagulants: Secondary | ICD-10-CM | POA: Diagnosis not present

## 2024-03-12 DIAGNOSIS — I129 Hypertensive chronic kidney disease with stage 1 through stage 4 chronic kidney disease, or unspecified chronic kidney disease: Secondary | ICD-10-CM | POA: Diagnosis not present

## 2024-03-12 DIAGNOSIS — Z7982 Long term (current) use of aspirin: Secondary | ICD-10-CM | POA: Diagnosis not present

## 2024-03-12 DIAGNOSIS — Z8673 Personal history of transient ischemic attack (TIA), and cerebral infarction without residual deficits: Secondary | ICD-10-CM | POA: Insufficient documentation

## 2024-03-12 DIAGNOSIS — Z79899 Other long term (current) drug therapy: Secondary | ICD-10-CM | POA: Insufficient documentation

## 2024-03-12 DIAGNOSIS — N1832 Chronic kidney disease, stage 3b: Secondary | ICD-10-CM | POA: Diagnosis not present

## 2024-03-12 LAB — URINALYSIS, ROUTINE W REFLEX MICROSCOPIC
Bilirubin Urine: NEGATIVE
Glucose, UA: NEGATIVE mg/dL
Hgb urine dipstick: NEGATIVE
Ketones, ur: NEGATIVE mg/dL
Leukocytes,Ua: NEGATIVE
Nitrite: NEGATIVE
Protein, ur: NEGATIVE mg/dL
Specific Gravity, Urine: 1.005 (ref 1.005–1.030)
pH: 6 (ref 5.0–8.0)

## 2024-03-12 LAB — COMPREHENSIVE METABOLIC PANEL WITH GFR
ALT: 15 U/L (ref 0–44)
AST: 19 U/L (ref 15–41)
Albumin: 4 g/dL (ref 3.5–5.0)
Alkaline Phosphatase: 47 U/L (ref 38–126)
Anion gap: 10 (ref 5–15)
BUN: 25 mg/dL — ABNORMAL HIGH (ref 8–23)
CO2: 23 mmol/L (ref 22–32)
Calcium: 9.6 mg/dL (ref 8.9–10.3)
Chloride: 107 mmol/L (ref 98–111)
Creatinine, Ser: 1.55 mg/dL — ABNORMAL HIGH (ref 0.44–1.00)
GFR, Estimated: 32 mL/min — ABNORMAL LOW (ref >60)
Glucose, Bld: 107 mg/dL — ABNORMAL HIGH (ref 70–99)
Potassium: 4.5 mmol/L (ref 3.5–5.1)
Sodium: 140 mmol/L (ref 135–145)
Total Bilirubin: 0.8 mg/dL (ref 0.0–1.2)
Total Protein: 6.1 g/dL — ABNORMAL LOW (ref 6.5–8.1)

## 2024-03-12 LAB — CBC
HCT: 41 % (ref 36.0–46.0)
Hemoglobin: 13.4 g/dL (ref 12.0–15.0)
MCH: 31.2 pg (ref 26.0–34.0)
MCHC: 32.7 g/dL (ref 30.0–36.0)
MCV: 95.3 fL (ref 80.0–100.0)
Platelets: 255 K/uL (ref 150–400)
RBC: 4.3 MIL/uL (ref 3.87–5.11)
RDW: 13.6 % (ref 11.5–15.5)
WBC: 8.7 K/uL (ref 4.0–10.5)
nRBC: 0 % (ref 0.0–0.2)

## 2024-03-12 LAB — TROPONIN T, HIGH SENSITIVITY
Troponin T High Sensitivity: 16 ng/L (ref 0–19)
Troponin T High Sensitivity: <15 ng/L (ref 0–19)

## 2024-03-12 LAB — D-DIMER, QUANTITATIVE: D-Dimer, Quant: 1.03 ug{FEU}/mL — ABNORMAL HIGH (ref 0.00–0.50)

## 2024-03-12 MED ORDER — MELATONIN 5 MG PO TABS
5.0000 mg | ORAL_TABLET | Freq: Every day | ORAL | Status: DC
  Administered 2024-03-12: 5 mg via ORAL
  Filled 2024-03-12: qty 1

## 2024-03-12 MED ORDER — POLYETHYLENE GLYCOL 3350 17 G PO PACK
17.0000 g | PACK | Freq: Every day | ORAL | Status: DC
  Filled 2024-03-12 (×2): qty 1

## 2024-03-12 MED ORDER — LOSARTAN POTASSIUM 50 MG PO TABS
100.0000 mg | ORAL_TABLET | Freq: Every day | ORAL | Status: DC
  Administered 2024-03-13: 100 mg via ORAL
  Filled 2024-03-12: qty 2

## 2024-03-12 MED ORDER — ALBUTEROL SULFATE (2.5 MG/3ML) 0.083% IN NEBU: 2.5000 mg | INHALATION_SOLUTION | RESPIRATORY_TRACT | Status: DC | PRN

## 2024-03-12 MED ORDER — SODIUM CHLORIDE 0.9 % IV BOLUS
500.0000 mL | Freq: Once | INTRAVENOUS | Status: AC
  Administered 2024-03-12: 500 mL via INTRAVENOUS

## 2024-03-12 MED ORDER — ENOXAPARIN SODIUM 30 MG/0.3ML IJ SOSY
30.0000 mg | PREFILLED_SYRINGE | Freq: Every day | INTRAMUSCULAR | Status: DC
  Filled 2024-03-12: qty 0.3

## 2024-03-12 MED ORDER — CLOPIDOGREL BISULFATE 75 MG PO TABS
75.0000 mg | ORAL_TABLET | Freq: Every day | ORAL | Status: DC
  Administered 2024-03-13: 75 mg via ORAL
  Filled 2024-03-12: qty 1

## 2024-03-12 MED ORDER — ONDANSETRON HCL 4 MG/2ML IJ SOLN: 4.0000 mg | Freq: Four times a day (QID) | INTRAMUSCULAR | Status: DC | PRN

## 2024-03-12 MED ORDER — AMLODIPINE BESYLATE 5 MG PO TABS
2.5000 mg | ORAL_TABLET | Freq: Every day | ORAL | Status: DC
  Administered 2024-03-13: 2.5 mg via ORAL
  Filled 2024-03-12: qty 1

## 2024-03-12 MED ORDER — ACETAMINOPHEN 325 MG PO TABS: 650.0000 mg | ORAL_TABLET | Freq: Four times a day (QID) | ORAL | Status: DC | PRN

## 2024-03-12 MED ORDER — BISACODYL 5 MG PO TBEC: 5.0000 mg | DELAYED_RELEASE_TABLET | Freq: Every day | ORAL | Status: DC | PRN

## 2024-03-12 MED ORDER — ACETAMINOPHEN 650 MG RE SUPP: 650.0000 mg | Freq: Four times a day (QID) | RECTAL | Status: DC | PRN

## 2024-03-12 MED ORDER — ONDANSETRON HCL 4 MG PO TABS: 4.0000 mg | ORAL_TABLET | Freq: Four times a day (QID) | ORAL | Status: DC | PRN

## 2024-03-12 NOTE — ED Notes (Signed)
 Pt ambulatory to the toilet and back to bed without incident.

## 2024-03-12 NOTE — ED Notes (Signed)
 ED Provider at bedside.

## 2024-03-12 NOTE — ED Notes (Signed)
 Patient up to toilet and back to bed without incident.

## 2024-03-12 NOTE — ED Provider Notes (Signed)
 [facility] Provider Note    Event Date/Time   First MD Initiated Contact with Patient 03/12/24 1244     (approximate)   History   Loss of Consciousness   HPI  Cheryl Rosales is a 89 y.o. female past medical history significant for recurrent neurocardiogenic syncope, hypertension, hyperlipidemia, CKD, who presents to the emergency department with syncope.  Patient was then her chair doing yoga when she became unresponsive.  Staff was unable to obtain a pulse but whenever she was brought to the floor to be given a rescue breathing she became responsive and interactive.  Lasted less than 1 minute.  No urinary or bowel incontinence.  Patient was disoriented immediately afterwards.  Does not recall the events today.  Denies any chest pain, shortness of breath, headache or neck pain.  States she was in her normal state of health this morning prior to arrival.  Does not recall the last episodes of today and she is uncertain of what happened.  Denies any numbness or weakness in extremity.  Denies any chest pain or shortness of breath.  Denies fever or chills or dysuria.  Does endorse an ongoing mild cough.  On chart review patient was recently evaluated by cardiology previously evaluated by cardiology for neurocardiogenic syncope dating back to 70 occurring a couple of times a year.  Patient had a 7-day Holter monitor in 2024.  Worker at the nursing facility states that she was present during this event and is now in the emergency department with the patient.  States that she was sitting doing chair yoga and started to state that she was feeling hot.  Patient then had an episode of unresponsiveness.  States that they were unable to palpate pulse and she was not breathing on her own.  States they lowered her to the ground and was doing rescue breathing.  This episode lasted approximately 3 minutes, as they were placing the AED pads on the patient she became responsive and  breathing on her own.  States she is looking much better at this time.     Physical Exam   Triage Vital Signs: ED Triage Vitals  Encounter Vitals Group     BP 03/12/24 1253 (!) 131/91     Girls Systolic BP Percentile --      Girls Diastolic BP Percentile --      Boys Systolic BP Percentile --      Boys Diastolic BP Percentile --      Pulse Rate 03/12/24 1253 71     Resp 03/12/24 1253 14     Temp --      Temp Source 03/12/24 1253 Oral     SpO2 03/12/24 1253 95 %     Weight --      Height --      Head Circumference --      Peak Flow --      Pain Score 03/12/24 1254 0     Pain Loc --      Pain Education --      Exclude from Growth Chart --     Most recent vital signs: Vitals:   03/12/24 1437 03/12/24 1441  BP: (!) 157/70 (!) 159/75  Pulse: 69 67  Resp:  17  Temp:    SpO2: 100% 100%    Physical Exam Constitutional:      Appearance: She is well-developed.  HENT:     Head: Atraumatic.  Eyes:     Extraocular Movements: Extraocular movements  intact.     Conjunctiva/sclera: Conjunctivae normal.     Pupils: Pupils are equal, round, and reactive to light.  Cardiovascular:     Rate and Rhythm: Regular rhythm.     Pulses: Normal pulses.     Heart sounds: No murmur heard. Pulmonary:     Effort: No respiratory distress.  Abdominal:     General: There is no distension.     Palpations: Abdomen is soft.     Tenderness: There is no abdominal tenderness. There is no right CVA tenderness or left CVA tenderness.  Musculoskeletal:        General: Normal range of motion.     Cervical back: Normal range of motion.     Right lower leg: No edema.     Left lower leg: No edema.  Skin:    General: Skin is warm.     Capillary Refill: Capillary refill takes less than 2 seconds.  Neurological:     Mental Status: She is alert. Mental status is at baseline.     GCS: GCS eye subscore is 4. GCS verbal subscore is 5. GCS motor subscore is 6.     Cranial Nerves: Cranial nerves 2-12 are  intact.     Sensory: Sensation is intact.     Motor: Motor function is intact.     Coordination: Coordination is intact.     IMPRESSION / MDM / ASSESSMENT AND PLAN / ED COURSE  I reviewed the triage vital signs and the nursing notes.  Differential diagnosis including dysrhythmia, electrolyte abnormality, ACS, intracranial hemorrhage, recurrent syncope  EKG  I, Clotilda Punter, the attending physician, personally viewed and interpreted this ECG. Normal sinus rhythm.  Prolonged PR interval.  No signs of heart block.  No significant ST elevation or depression.  No findings of acute ischemia or dysrhythmia.  No change when compared to prior EKG  No tachycardic or bradycardic dysrhythmias while on cardiac telemetry.  RADIOLOGY I independently reviewed imaging, my interpretation of imaging: Chest x-ray with no signs of pneumonia, widened mediastinum or pneumothorax  CT scan of the head -no acute findings  LABS (all labs ordered are listed, but only abnormal results are displayed) Labs interpreted as -    Labs Reviewed  COMPREHENSIVE METABOLIC PANEL WITH GFR - Abnormal; Notable for the following components:      Result Value   Glucose, Bld 107 (*)    BUN 25 (*)    Creatinine, Ser 1.55 (*)    Total Protein 6.1 (*)    GFR, Estimated 32 (*)    All other components within normal limits  URINALYSIS, ROUTINE W REFLEX MICROSCOPIC - Abnormal; Notable for the following components:   Color, Urine STRAW (*)    APPearance CLEAR (*)    All other components within normal limits  CBC  MAGNESIUM  TROPONIN T, HIGH SENSITIVITY  TROPONIN T, HIGH SENSITIVITY     MDM  On chart review patient had a recent echocardiogram this year that did not show any findings of aortic stenosis and no signs of aortic regurgitation.  Patient has been followed by cardiology for recurrent episodes of syncope and has had multiple Holter monitors.  No longer on metoprolol  given that she had low heart rates and sinus  pauses.  No leukocytosis.  No anemia and platelets are at her normal.  Troponin is negative, have a low suspicion for ACS, no active chest pain or shortness of breath at this time.  Signs of urinary tract infection.  CT scan  of head with no signs of intracranial hemorrhage or infarction.  Chest x-ray my evaluation concern for possible pleural effusions bilaterally but otherwise does not appear volume overloaded no shortness of breath.  Does endorse a mild cough but no other infectious findings.  Consulted hospitalist for admission/observation for high risk syncope     PROCEDURES:  Critical Care performed: No  Procedures  Patient's presentation is most consistent with acute presentation with potential threat to life or bodily function.   MEDICATIONS ORDERED IN ED: Medications  sodium chloride  0.9 % bolus 500 mL (0 mLs Intravenous Stopped 03/12/24 1501)    FINAL CLINICAL IMPRESSION(S) / ED DIAGNOSES   Final diagnoses:  Syncope, unspecified syncope type     Rx / DC Orders   ED Discharge Orders     None        Note:  This document was prepared using Dragon voice recognition software and may include unintentional dictation errors.   Suzanne Kirsch, MD 03/12/24 [phone]

## 2024-03-12 NOTE — H&P (Addendum)
 History and Physical  Cheryl Rosales FMW:[id number] DOB: 1935/03/05 DOA: 03/12/2024 PCP: Auston Reyes BIRCH, MD  Chief Complaint: syncope Historian: patient  HPI:  Cheryl Rosales is a 89 y.o. female with a PMH significant for PE, syncope, HLD, CVA, CKD 3b, HTN, prediabetes. At baseline, they live alone and are mostly independent with their ADLs but their son visits from [place] often to arrange her medication boxes for her.  They presented from a chair yoga class to the ED on 03/12/2024 with syncope. Per report from the facility, patient passed out and was unconscious without a pulse for three minutes. She came to while they were placing AED pads on her. She remembers being in the class but does not remember any symptoms prior to the event and following remembers being in the ambulance.  Currently, she states that she has no chest pain, no SOB and feels like her normal self. Denies any recent medication changes. She did take her morning medications and ate well today.  Has remote history of syncope with cardiac monitoring and no significant arrhythmias identified. Denies history of this type of episode.   In the ED, it was found that they had stable vital signs.  Significant findings included: metabolic panel is unremarkable with Cr 1.55 around her baseline. Stone cold normal CBC. Troponins trended negative x2. D-dimer is 1.03.   They were initially treated with NS bolus.   Patient was admitted to medicine service for further workup and management of syncope vs cardiac arrest with spontaneous recovery as outlined in detail below.  Assessment/Plan Principal Problem:   Syncope   Syncope vs cardiac arrest with spontaneous recovery? Per report- had no pulse for 3 minutes and then just woke up like nothing had happened. No seizure like activity, no post-ictal period. Patient does not remember having any prodromal symptoms and felt like herself following the episode. No arrhythmias have  been captured on monitor yet. PE less likely with normal Ddimer. No acute intracranial abnormalities on head CT.  Her exam is normal and denies any symptoms. Has no recent illness nor medication changes. Goes to chair yoga twice per week and never had this happen before. Etiology and what happened are unknown at this time.  - continue on telemetry and would likely benefit from ambulatory monitoring long term on dc - TSH am  HTN- continue home meds  CKDIIIa- at baseline  H/o CVA- no deficits or acute findings on head CT. Continue plavix   Past Medical History:  Diagnosis Date   Arthritis    knees, fingers, thumbs   Cancer (HCC)    skin   H/O blood clots 2016   bilateral lungs - attributed to Evista use   Hypertension    Neurocardiogenic syncope     Past Surgical History:  Procedure Laterality Date   APPENDECTOMY     BREAST BIOPSY Left    neg   CATARACT EXTRACTION W/PHACO Left 02/04/2016   Procedure: CATARACT EXTRACTION PHACO AND INTRAOCULAR LENS PLACEMENT (IOC);  Surgeon: Dene Etienne, MD;  Location: [facility];  Service: Ophthalmology;  Laterality: Left;  PT WOULD LIKE EARLY AM   CATARACT EXTRACTION W/PHACO Right 02/25/2016   Procedure: CATARACT EXTRACTION PHACO AND INTRAOCULAR LENS PLACEMENT (IOC);  Surgeon: Dene Etienne, MD;  Location: [facility];  Service: Ophthalmology;  Laterality: Right;  PT PREFERS EARLY AM   COLONOSCOPY     TONSILLECTOMY       reports that she has never smoked. She has never used smokeless tobacco.  She reports that she does not drink alcohol. No history on file for drug use.  Allergies  Allergen Reactions   Mobic  [Meloxicam ] Shortness Of Breath    Per pt report   Codeine Swelling   Hydrocodone Nausea And Vomiting   Oxycodone Nausea And Vomiting   Raloxifene Other (See Comments)    Pt reports MD told her possibly gave her blood clots  Pt reports MD told her possibly gave her blood clots  Pt reports MD told her  possibly gave her blood clots   Meperidine Swelling and Dermatitis    Family History  Problem Relation Age of Onset   Breast cancer Mother 9   Breast cancer Paternal Aunt    Breast cancer Maternal Grandmother        great    Prior to Admission medications   Medication Sig Start Date End Date Taking? Authorizing Provider  amLODipine  (NORVASC ) 2.5 MG tablet Take 2.5 mg by mouth daily. 12/20/23 12/19/24 Yes [provider]  triamcinolone cream (KENALOG) 0.5 % Apply 1 Application topically 2 (two) times daily. 02/22/24 02/21/25 Yes [provider]  acetaminophen  (ARTHRITIS PAIN) 650 MG CR tablet Take 650 mg by mouth every 8 (eight) hours as needed for pain.    [provider]  aspirin  EC 81 MG tablet Take 1 tablet (81 mg total) by mouth daily. Swallow whole. 09/11/23   Awanda City, MD  atorvastatin  (LIPITOR) 40 MG tablet Take 1 tablet (40 mg total) by mouth daily. 09/11/23   Awanda City, MD  cetirizine (ZYRTEC) 10 MG tablet Take 10 mg by mouth daily as needed for allergies.    [provider]  clopidogrel  (PLAVIX ) 75 MG tablet Take 1 tablet (75 mg total) by mouth daily. 10/11/23   Eubanks, Jessica K, NP  hydrALAZINE (APRESOLINE) 25 MG tablet Take 25 mg by mouth 3 (three) times daily.    [provider]  losartan  (COZAAR ) 100 MG tablet Take 100 mg by mouth daily.    [provider]  solifenacin (VESICARE) 10 MG tablet Take 10 mg by mouth daily.    [provider]   I have personally, briefly reviewed patient's prior medical records in [facility]  Objective: Blood pressure (!) 175/87, pulse 66, temperature (!) 97.1 F (36.2 C), temperature source Oral, resp. rate 17, height 5' 4 (1.626 m), weight 72.6 kg, SpO2 100%.   Constitutional: NAD, calm, comfortable HEENT: lids and conjunctivae normal. MMM. Posterior pharynx clear of any exudate or lesions. Normal dentition.  Neck: normal, supple, no masses, no thyromegaly Respiratory: CTAB,  no wheezing, no crackles. Normal respiratory effort. No accessory muscle use.  Cardiovascular: RRR, no murmurs / rubs / gallops. No extremity edema. 2+ pedal pulses. no clubbing / cyanosis.  Abdomen: soft, NT, ND, no masses or HSM palpated. Musculoskeletal: No joint deformity upper and lower extremities. Normal muscle tone.  Skin: dry, intact, normal color, normal temperature on exposed skin Neurologic: Alert and oriented x 3. Normal speech. Grossly non-focal exam. PERRL Psychiatric: Normal mood. Congruent affect.  Labs on Admission: I have personally reviewed admission labs and imaging studies  CBC    Component Value Date/Time   WBC 8.7 03/12/2024 1256   RBC 4.30 03/12/2024 1256   HGB 13.4 03/12/2024 1256   HGB 13.5 06/02/2013 1450   HCT 41.0 03/12/2024 1256   HCT 40.2 06/02/2013 1450   PLT 255 03/12/2024 1256   PLT 234 06/02/2013 1450   MCV 95.3 03/12/2024 1256   MCV 96 06/02/2013  1450   MCH 31.2 03/12/2024 1256   MCHC 32.7 03/12/2024 1256   RDW 13.6 03/12/2024 1256   RDW 13.5 06/02/2013 1450   LYMPHSABS 1.3 09/08/2023 1702   MONOABS 1.1 (H) 09/08/2023 1702   EOSABS 0.0 09/08/2023 1702   BASOSABS 0.0 09/08/2023 1702   CMP     Component Value Date/Time   NA 140 03/12/2024 1256   NA 138 06/02/2013 1450   K 4.5 03/12/2024 1256   K 4.1 06/02/2013 1450   CL 107 03/12/2024 1256   CL 102 06/02/2013 1450   CO2 23 03/12/2024 1256   CO2 29 06/02/2013 1450   GLUCOSE 107 (H) 03/12/2024 1256   GLUCOSE 101 (H) 06/02/2013 1450   BUN 25 (H) 03/12/2024 1256   BUN 12 06/02/2013 1450   CREATININE 1.55 (H) 03/12/2024 1256   CREATININE 1.01 06/02/2013 1450   CALCIUM  9.6 03/12/2024 1256   CALCIUM  9.5 06/02/2013 1450   PROT 6.1 (L) 03/12/2024 1256   ALBUMIN 4.0 03/12/2024 1256   AST 19 03/12/2024 1256   ALT 15 03/12/2024 1256   ALKPHOS 47 03/12/2024 1256   BILITOT 0.8 03/12/2024 1256   GFRNONAA 32 (L) 03/12/2024 1256   GFRNONAA 54 (L) 06/02/2013 1450   GFRAA 46 (L) 11/09/2019  1120   GFRAA >60 06/02/2013 1450    Radiological Exams on Admission: CT Head Wo Contrast Result Date: 03/12/2024 EXAM: CT HEAD WITHOUT CONTRAST 03/12/2024 02:09:23 PM TECHNIQUE: CT of the head was performed without the administration of intravenous contrast. Automated exposure control, iterative reconstruction, and/or weight based adjustment of the mA/kV was utilized to reduce the radiation dose to as low as reasonably achievable. COMPARISON: CT head 09/08/2023 CLINICAL HISTORY: Syncope/presyncope, cerebrovascular cause suspected FINDINGS: BRAIN AND VENTRICLES: No acute hemorrhage. No evidence of acute infarct. No hydrocephalus. No extra-axial collection. No mass effect or midline shift. Remote right basal ganglia lacunar infarct. Patchy white matter hypodensities, compatible with chronic microvascular ischemic changes ORBITS: No acute abnormality. SINUSES: No acute abnormality. SOFT TISSUES AND SKULL: No acute soft tissue abnormality. No skull fracture. IMPRESSION: 1. No acute intracranial abnormality. Electronically signed by: Gilmore Molt MD 03/12/2024 02:43 PM EST RP Workstation: [id number]   DG Chest Portable 1 View Result Date: 03/12/2024 EXAM: 1 VIEW(S) XRAY OF THE CHEST 03/12/2024 01:16:54 PM COMPARISON: CT dated 11/09/2019. CLINICAL HISTORY: syncope, fall FINDINGS: LINES, TUBES AND DEVICES: Multiple wires and leads project over the chest on the frontal radiograph. LUNGS AND PLEURA: Small left pleural effusion. Left greater than right basilar airspace disease. No pneumothorax. HEART AND MEDIASTINUM: No acute abnormality of the cardiac and mediastinal silhouettes. BONES AND SOFT TISSUES: No acute osseous abnormality. IMPRESSION: 1. Left greater than right basilar airspace disease, fetal atelectasis.no posttraumatic deformity identified. 2. Small left pleural effusion. Electronically signed by: Rockey Kilts MD 03/12/2024 01:54 PM EST RP Workstation: [id number]   EKG: Independently reviewed. NSR.  HR 71  DVT prophylaxis: enoxaparin  (LOVENOX ) injection 30 mg Start: 03/12/24 1615  Code Status: full  Family Communication: she asked me to not contact her son tonight since he is doing something important and already knows she is here   Disposition Plan: admit for obs  Consults called: none    Marien LITTIE Piety, DO [facility]  03/12/2024, 4:06 PM    To contact the appropriate [facility] Attending or Consulting provider: Check [email or web address] for coverage from 7pm-7am

## 2024-03-12 NOTE — ED Provider Notes (Signed)
  Physical Exam  BP (!) 175/87   Pulse 66   Temp (!) 97.1 F (36.2 C) (Oral)   Resp 17   Ht 5' 4 (1.626 m)   Wt 72.6 kg   SpO2 100%   BMI 27.46 kg/m   Physical Exam  Procedures  Procedures  ED Course / MDM   Clinical Course as of 03/12/24 1605  Mon Mar 12, 2024  1549 Signout from Dr. Suzanne: 89 year old female PMH prior syncopal episodes, hypertension, hyperlipidemia, CKD presents for evaluation of loss of consciousness episode with ? transient loss of pulses - Reportedly returned to baseline mental status after few minutes of BVM on scene, no shocks or CPR given -Workup thus far unremarkable -Plan for admission, hospitalist consult order placed [MM]  1605 Presented to hospitalist for admission [MM]    Clinical Course User Index [MM] Clarine Ozell LABOR, MD   Medical Decision Making Amount and/or Complexity of Data Reviewed Labs: ordered. Radiology: ordered.  Risk Decision regarding hospitalization.          Clarine Ozell LABOR, MD 03/12/24 [phone]

## 2024-03-12 NOTE — ED Notes (Signed)
 Dietary called for dinner tray- en route

## 2024-03-12 NOTE — ED Triage Notes (Signed)
 Brought by ambulance, from [facility].   Pt had a syncope while doing chair yoga. Became unresponsive, staff was unable to found pulse, pt was brought to the floor to be give rescue breathing. However, pt came back SBP 100's palpate, and HR 70, pt was disoriented.   Vital 121/68 95% spo2  RA Aox4.  Pt alert and oriented x4, don't remember what happened and denies pain.

## 2024-03-12 NOTE — ED Notes (Signed)
 CCMD called.

## 2024-03-13 DIAGNOSIS — R55 Syncope and collapse: Secondary | ICD-10-CM | POA: Diagnosis not present

## 2024-03-13 LAB — TSH: TSH: 2.48 u[IU]/mL (ref 0.350–4.500)

## 2024-03-13 LAB — MAGNESIUM: Magnesium: 2.4 mg/dL (ref 1.7–2.4)

## 2024-03-13 MED ORDER — BISACODYL 5 MG PO TBEC: 5.0000 mg | DELAYED_RELEASE_TABLET | Freq: Every day | ORAL | 0 refills | Status: AC | PRN

## 2024-03-13 NOTE — Discharge Summary (Signed)
 Physician Discharge Summary   Patient: Cheryl Rosales MRN: [id number] DOB: 02/01/1935  Admit date:     03/12/2024  Discharge date: 03/13/24  Discharge Physician: Drue ONEIDA Potter   PCP: Auston Reyes BIRCH, MD   Recommendations at discharge:  Follow-up with outpatient cardiology  Discharge Diagnoses: Syncope of unclear etiology Hypertension CKD stage III AA Prior CVA with no deficit  Hospital Course:  From HPI Cheryl Rosales is a 89 y.o. female with a PMH significant for PE, syncope, HLD, CVA, CKD 3b, HTN, prediabetes. At baseline, she live alone and are mostly independent with their ADLs but her son visits from [place] often to arrange her medication boxes for her.   They presented from a chair yoga class to the ED on 03/12/2024 with syncope. Per report from the facility, patient passed out and was unconscious without a pulse for three minutes. She came to while they were placing AED pads on her. She remembers being in the class but does not remember any symptoms prior to the event and following remembers being in the ambulance.  Currently, she states that she has no chest pain, no SOB and feels like her normal self. Denies any recent medication changes. She did take her morning medications and ate well today.  Has remote history of syncope with cardiac monitoring and no significant arrhythmias identified. Denies history of this type of episode.    In the ED, it was found that they had stable vital signs.  Significant findings included: metabolic panel is unremarkable with Cr 1.55 around her baseline. Stone cold normal CBC. Troponins trended negative x2. D-dimer is 1.03.    She was initially treated with NS bolus.    Patient was admitted to medicine service for further workup and management of syncope vs cardiac arrest with spontaneous recovery as outlined in detail below.     Syncope vs cardiac arrest with spontaneous recovery? Per report- had no pulse for 3 minutes and then  just woke up like nothing had happened. No seizure like activity, no post-ictal period. Patient does not remember having any prodromal symptoms and felt like herself following the episode. No arrhythmias have been captured on monitor yet. PE less likely with normal Ddimer. No acute intracranial abnormalities on head CT.  Patient was seen by cardiologist and had cardiac monitor placed. Cardiology plans to see patient as an outpatient   HTN- continue home meds   CKDIIIa- at baseline   H/o CVA- no deficits or acute findings on head CT. Continue plavix    Consultants: Cardiology Procedures performed: Cardiac monitor placement Disposition: Assisted living Diet recommendation:  Cardiac diet DISCHARGE MEDICATION: Allergies as of 03/13/2024       Reactions   Mobic  [meloxicam ] Shortness Of Breath   Per pt report   Codeine Swelling   Hydrocodone Nausea And Vomiting   Oxycodone Nausea And Vomiting   Raloxifene Other (See Comments)   Pt reports MD told her possibly gave her blood clots Pt reports MD told her possibly gave her blood clots  Pt reports MD told her possibly gave her blood clots   Meperidine Swelling, Dermatitis        Medication List     STOP taking these medications    cetirizine 10 MG tablet Commonly known as: ZYRTEC   hydrALAZINE 25 MG tablet Commonly known as: APRESOLINE       TAKE these medications    amLODipine  2.5 MG tablet Commonly known as: NORVASC  Take 2.5 mg by mouth daily.  Arthritis Pain 650 MG CR tablet Generic drug: acetaminophen  Take 650 mg by mouth every 8 (eight) hours as needed for pain.   aspirin  EC 81 MG tablet Take 1 tablet (81 mg total) by mouth daily. Swallow whole.   atorvastatin  40 MG tablet Commonly known as: LIPITOR Take 1 tablet (40 mg total) by mouth daily.   bisacodyl  5 MG EC tablet Commonly known as: DULCOLAX Take 1 tablet (5 mg total) by mouth daily as needed for moderate constipation.   clopidogrel  75 MG  tablet Commonly known as: PLAVIX  Take 1 tablet (75 mg total) by mouth daily.   losartan  100 MG tablet Commonly known as: COZAAR  Take 100 mg by mouth daily.   solifenacin 10 MG tablet Commonly known as: VESICARE Take 10 mg by mouth daily.   triamcinolone cream 0.5 % Commonly known as: KENALOG Apply 1 Application topically 2 (two) times daily.        Follow-up Information     Clarisa Kung, [place]. Go in 1 week(s).   Why: Appointment dates/times provided to patient Contact information: [address] [facility] [place] [address] [phone]                Discharge Exam: Filed Weights   03/12/24 1559  Weight: 72.6 kg   Neck: normal, supple, no masses, no thyromegaly Respiratory: CTAB, no wheezing, no crackles. Normal respiratory effort. No accessory muscle use.  Cardiovascular: RRR, no murmurs / rubs / gallops. No extremity edema. 2+ pedal pulses. no clubbing / cyanosis.  Abdomen: soft, NT, ND, no masses or HSM palpated. Musculoskeletal: No joint deformity upper and lower extremities. Normal muscle tone.  Skin: dry, intact, normal color, normal temperature on exposed skin Neurologic: Alert and oriented x 3. Normal speech. Grossly non-focal exam. PERRL Psychiatric: Normal mood. Congruent affect.  Condition at discharge: good  The results of significant diagnostics from this hospitalization (including imaging, microbiology, ancillary and laboratory) are listed below for reference.   Imaging Studies: CT Head Wo Contrast Result Date: 03/12/2024 EXAM: CT HEAD WITHOUT CONTRAST 03/12/2024 02:09:23 PM TECHNIQUE: CT of the head was performed without the administration of intravenous contrast. Automated exposure control, iterative reconstruction, and/or weight based adjustment of the mA/kV was utilized to reduce the radiation dose to as low as reasonably achievable. COMPARISON: CT head 09/08/2023 CLINICAL HISTORY: Syncope/presyncope, cerebrovascular cause  suspected FINDINGS: BRAIN AND VENTRICLES: No acute hemorrhage. No evidence of acute infarct. No hydrocephalus. No extra-axial collection. No mass effect or midline shift. Remote right basal ganglia lacunar infarct. Patchy white matter hypodensities, compatible with chronic microvascular ischemic changes ORBITS: No acute abnormality. SINUSES: No acute abnormality. SOFT TISSUES AND SKULL: No acute soft tissue abnormality. No skull fracture. IMPRESSION: 1. No acute intracranial abnormality. Electronically signed by: Gilmore Molt MD 03/12/2024 02:43 PM EST RP Workstation: [id number]   DG Chest Portable 1 View Result Date: 03/12/2024 EXAM: 1 VIEW(S) XRAY OF THE CHEST 03/12/2024 01:16:54 PM COMPARISON: CT dated 11/09/2019. CLINICAL HISTORY: syncope, fall FINDINGS: LINES, TUBES AND DEVICES: Multiple wires and leads project over the chest on the frontal radiograph. LUNGS AND PLEURA: Small left pleural effusion. Left greater than right basilar airspace disease. No pneumothorax. HEART AND MEDIASTINUM: No acute abnormality of the cardiac and mediastinal silhouettes. BONES AND SOFT TISSUES: No acute osseous abnormality. IMPRESSION: 1. Left greater than right basilar airspace disease, fetal atelectasis.no posttraumatic deformity identified. 2. Small left pleural effusion. Electronically signed by: Rockey Kilts MD 03/12/2024 01:54 PM EST RP Workstation: [id number]    Microbiology: Results  for orders placed or performed in visit on 03/05/19  Novel Coronavirus, NAA ([facility])     Status: None   Collection Time: 03/05/19 12:00 AM   Specimen: Nasopharyngeal(NP) swabs in vial transport medium   NASOPHARYNGE  TESTING  Result Value Ref Range Status   SARS-CoV-2, NAA Not Detected Not Detected Final    Comment: This nucleic acid amplification test was developed and its performance characteristics determined by [employer]. Nucleic acid amplification tests include PCR and TMA. This test has not been FDA cleared  or approved. This test has been authorized by FDA under an Emergency Use Authorization (EUA). This test is only authorized for the duration of time the declaration that circumstances exist justifying the authorization of the emergency use of in vitro diagnostic tests for detection of SARS-CoV-2 virus and/or diagnosis of COVID-19 infection under section 564(b)(1) of the Act, 21 U.S.C. 639aaa-6(a) (1), unless the authorization is terminated or revoked sooner. When diagnostic testing is negative, the possibility of a false negative result should be considered in the context of a patient's recent exposures and the presence of clinical signs and symptoms consistent with COVID-19. An individual without symptoms of COVID-19 and who is not shedding SARS-CoV-2 virus would  expect to have a negative (not detected) result in this assay.     Labs: CBC: Recent Labs  Lab 03/12/24 1256  WBC 8.7  HGB 13.4  HCT 41.0  MCV 95.3  PLT 255   Basic Metabolic Panel: Recent Labs  Lab 03/12/24 1256  NA 140  K 4.5  CL 107  CO2 23  GLUCOSE 107*  BUN 25*  CREATININE 1.55*  CALCIUM  9.6  MG 2.4   Liver Function Tests: Recent Labs  Lab 03/12/24 1256  AST 19  ALT 15  ALKPHOS 47  BILITOT 0.8  PROT 6.1*  ALBUMIN 4.0   CBG: No results for input(s): GLUCAP in the last 168 hours.  Discharge time spent:  35 minutes.  Signed: Drue ONEIDA Potter, MD [facility] 03/13/2024

## 2024-03-13 NOTE — ED Notes (Signed)
 Pt given DC instructions. Pt verbalized understanding of medications and follow up care for heart monitor applied today in ED. Pt insisted on waiting in the lobby for transport from [facility] so they don't  have to get out of the care. Pt ambulatory from ED with steady gait with this RN. NAD noted at time of departure.

## 2024-03-13 NOTE — ED Notes (Signed)
 Called [facility] to arrange transport for pt back to facility. Per Izetta, someone will arrive in about 20-25 mins to pick up pt.

## 2024-03-13 NOTE — ED Notes (Signed)
 Assumed care of pt from [facility]. Introduced self to pt. Pt has no request at this time. Call bell in reach.

## 2024-05-14 ENCOUNTER — Ambulatory Visit: Admitting: Urology

## 2024-05-15 ENCOUNTER — Emergency Department
Admission: EM | Admit: 2024-05-15 | Discharge: 2024-05-15 | Disposition: A | Discharge status: Home / Self Care | Attending: Emergency Medicine | Admitting: Emergency Medicine

## 2024-05-15 ENCOUNTER — Other Ambulatory Visit: Payer: Self-pay

## 2024-05-15 ENCOUNTER — Emergency Department

## 2024-05-15 DIAGNOSIS — N183 Chronic kidney disease, stage 3 unspecified: Secondary | ICD-10-CM | POA: Diagnosis not present

## 2024-05-15 DIAGNOSIS — G9341 Metabolic encephalopathy: Secondary | ICD-10-CM | POA: Diagnosis not present

## 2024-05-15 DIAGNOSIS — I129 Hypertensive chronic kidney disease with stage 1 through stage 4 chronic kidney disease, or unspecified chronic kidney disease: Secondary | ICD-10-CM | POA: Insufficient documentation

## 2024-05-15 DIAGNOSIS — N3000 Acute cystitis without hematuria: Secondary | ICD-10-CM | POA: Insufficient documentation

## 2024-05-15 DIAGNOSIS — Z8673 Personal history of transient ischemic attack (TIA), and cerebral infarction without residual deficits: Secondary | ICD-10-CM | POA: Insufficient documentation

## 2024-05-15 DIAGNOSIS — R1030 Lower abdominal pain, unspecified: Secondary | ICD-10-CM | POA: Diagnosis present

## 2024-05-15 LAB — COMPREHENSIVE METABOLIC PANEL WITH GFR
ALT: 15 U/L (ref 0–44)
AST: 26 U/L (ref 15–41)
Albumin: 3.9 g/dL (ref 3.5–5.0)
Alkaline Phosphatase: 74 U/L (ref 38–126)
Anion gap: 9 (ref 5–15)
BUN: 30 mg/dL — ABNORMAL HIGH (ref 8–23)
CO2: 22 mmol/L (ref 22–32)
Calcium: 9.6 mg/dL (ref 8.9–10.3)
Chloride: 109 mmol/L (ref 98–111)
Creatinine, Ser: 1.21 mg/dL — ABNORMAL HIGH (ref 0.44–1.00)
GFR, Estimated: 43 mL/min — ABNORMAL LOW
Glucose, Bld: 93 mg/dL (ref 70–99)
Potassium: 5.3 mmol/L — ABNORMAL HIGH (ref 3.5–5.1)
Sodium: 140 mmol/L (ref 135–145)
Total Bilirubin: 0.5 mg/dL (ref 0.0–1.2)
Total Protein: 6.2 g/dL — ABNORMAL LOW (ref 6.5–8.1)

## 2024-05-15 LAB — CBC WITH DIFFERENTIAL/PLATELET
Abs Immature Granulocytes: 0.02 10*3/uL (ref 0.00–0.07)
Basophils Absolute: 0.1 10*3/uL (ref 0.0–0.1)
Basophils Relative: 1 %
Eosinophils Absolute: 0.3 10*3/uL (ref 0.0–0.5)
Eosinophils Relative: 4 %
HCT: 41.6 % (ref 36.0–46.0)
Hemoglobin: 13.3 g/dL (ref 12.0–15.0)
Immature Granulocytes: 0 %
Lymphocytes Relative: 24 %
Lymphs Abs: 1.8 10*3/uL (ref 0.7–4.0)
MCH: 31.2 pg (ref 26.0–34.0)
MCHC: 32 g/dL (ref 30.0–36.0)
MCV: 97.7 fL (ref 80.0–100.0)
Monocytes Absolute: 0.7 10*3/uL (ref 0.1–1.0)
Monocytes Relative: 9 %
Neutro Abs: 4.7 10*3/uL (ref 1.7–7.7)
Neutrophils Relative %: 62 %
Platelets: 289 10*3/uL (ref 150–400)
RBC: 4.26 MIL/uL (ref 3.87–5.11)
RDW: 14.3 % (ref 11.5–15.5)
WBC: 7.5 10*3/uL (ref 4.0–10.5)
nRBC: 0 % (ref 0.0–0.2)

## 2024-05-15 LAB — URINALYSIS, ROUTINE W REFLEX MICROSCOPIC
Bilirubin Urine: NEGATIVE
Glucose, UA: NEGATIVE mg/dL
Hgb urine dipstick: NEGATIVE
Ketones, ur: NEGATIVE mg/dL
Nitrite: NEGATIVE
Protein, ur: NEGATIVE mg/dL
Specific Gravity, Urine: 1.02 (ref 1.005–1.030)
pH: 5 (ref 5.0–8.0)

## 2024-05-15 MED ORDER — CEFUROXIME AXETIL 250 MG PO TABS: 250.0000 mg | ORAL_TABLET | Freq: Two times a day (BID) | ORAL | 0 refills | Status: AC

## 2024-05-15 MED ORDER — CEFUROXIME AXETIL 250 MG PO TABS
250.0000 mg | ORAL_TABLET | Freq: Once | ORAL | Status: AC
  Administered 2024-05-15: 250 mg via ORAL
  Filled 2024-05-15: qty 1

## 2024-05-15 NOTE — Discharge Instructions (Signed)
 Ceftin /cefuroxime  antibiotics twice daily for 5 days.  First dose received in the ED.  Please do provide a second dose tonight before bed  Twice daily after this until prescription runs out.  Continue all other prescriptions  Return to the ED with any worsening symptoms despite these measures

## 2024-05-15 NOTE — ED Provider Notes (Signed)
 "  [facility] Provider Note    Event Date/Time   First MD Initiated Contact with Patient 05/15/24 1002     (approximate)   History   Altered Mental Status   HPI  Cheryl Rosales is a 89 y.o. female who presents to the ED for evaluation of Altered Mental Status   Review of PCP visit from 1/15.  History of HTN, HLD, CVA, CKD 3 on DAPT.  Resides at a local independent living facility, presents to the ED due to reported altered mentation from her facility.  Did not respond to wake up phone call as normal and per EMS reports, nursing staff was concerned that she was not acting normally this morning.  Here in the ED, she has no complaints and reports that she feels fine and does not know why she is here   Physical Exam   Triage Vital Signs: ED Triage Vitals [05/15/24 1004]  Encounter Vitals Group     BP      Girls Systolic BP Percentile      Girls Diastolic BP Percentile      Boys Systolic BP Percentile      Boys Diastolic BP Percentile      Pulse      Resp      Temp      Temp src      SpO2      Weight      Height      Head Circumference      Peak Flow      Pain Score 0     Pain Loc      Pain Education      Exclude from Growth Chart     Most recent vital signs: Vitals:   05/15/24 1005 05/15/24 1030  BP:  (!) 172/88  Pulse:  67  Resp:  19  Temp: 97.8 F (36.6 C)   SpO2:  93%    General: Awake, no distress.  CV:  Good peripheral perfusion.  Resp:  Normal effort.  Abd:  No distention.  Suprapubic tenderness, otherwise nontender and soft MSK:  No deformity noted.  Neuro:  No focal deficits appreciated. Other:     ED Results / Procedures / Treatments   Labs (all labs ordered are listed, but only abnormal results are displayed) Labs Reviewed  COMPREHENSIVE METABOLIC PANEL WITH GFR - Abnormal; Notable for the following components:      Result Value   Potassium 5.3 (*)    BUN 30 (*)    Creatinine, Ser 1.21 (*)    Total Protein  6.2 (*)    GFR, Estimated 43 (*)    All other components within normal limits  URINALYSIS, ROUTINE W REFLEX MICROSCOPIC - Abnormal; Notable for the following components:   Color, Urine YELLOW (*)    APPearance HAZY (*)    Leukocytes,Ua MODERATE (*)    Bacteria, UA RARE (*)    All other components within normal limits  URINE CULTURE  CBC WITH DIFFERENTIAL/PLATELET    EKG Sinus rhythm with a rate of 70 bpm.  Normal axis and intervals.  No clear signs of acute ischemia.  RADIOLOGY CXR interpreted by me without evidence of acute cardiopulmonary pathology.  Official radiology report(s): DG Chest 2 View Result Date: 05/15/2024 CLINICAL DATA:  eval PNA. recent rib fractures, now acutely altered, suspect metabolic EXAM: CHEST - 2 VIEW COMPARISON:  03/12/2024 FINDINGS: Cardiomediastinal silhouette and pulmonary vasculature are within normal limits. Lungs are clear. Unchanged T7  compression fracture. IMPRESSION: No acute cardiopulmonary process. Electronically Signed   By: Aliene Lloyd M.D.   On: 05/15/2024 11:58    PROCEDURES and INTERVENTIONS:  .1-3 Lead EKG Interpretation  Performed by: Claudene Rover, MD Authorized by: Claudene Rover, MD     Interpretation: normal     ECG rate:  70   ECG rate assessment: normal     Rhythm: sinus rhythm     Ectopy: none     Conduction: normal     Medications  cefUROXime  (CEFTIN ) tablet 250 mg (has no administration in time range)     IMPRESSION / MDM / ASSESSMENT AND PLAN / ED COURSE  I reviewed the triage vital signs and the nursing notes.  Differential diagnosis includes, but is not limited to, stroke, ICH, sepsis, metabolic cephalopathy, UTI, pneumonia, respiratory failure, AKI, symptomatic anemia  {Patient presents with symptoms of an acute illness or injury that is potentially life-threatening.  Patient presents with nonfocal mild encephalopathy, likely metabolic in the setting of a UTI and suitable for outpatient management with  antibiotics.  Vitals are essentially normal, mild hypertension is noted.  Nonfocal exam without evidence of deficits.  Mild suprapubic tenderness is noted as only derangement.  Hemolyzed metabolic panel with renal function at baseline.  Normal CBC, urine sent for culture and likely the source of her encephalopathy.  Started on antibiotics and discharged with the same.  Discussed ED return precautions  Clinical Course as of 05/15/24 1237  Tue May 15, 2024  1127 Reassessed, caregiver from [place] is at the bedside, has been with the patient for the past 6 months and knows her well.  Saw her on Sunday evening and patient was normal but today was clearly altered and not at her baseline, voided on herself which is unusual.  Also tells me that patient had a recent fall and broken ribs in the right [DS]  1233 Reassessed, son now at the bedside in addition to caregiver.  He reports that she seems about normal.  No additional information to add to the workup today.  We discussed likely UTI and subsequent outpatient management, they are agreeable [DS]    Clinical Course User Index [DS] Kataleya Zaugg, MD     FINAL CLINICAL IMPRESSION(S) / ED DIAGNOSES   Final diagnoses:  Acute metabolic encephalopathy  Acute cystitis without hematuria     Rx / DC Orders   ED Discharge Orders          Ordered    cefUROXime (CEFTIN) 250 MG tablet  2 times daily with meals        01 /27/26 1234             Note:  This document was prepared using Dragon voice recognition software and may include unintentional dictation errors.   Claudene Rover, MD 05/15/24 1238  "

## 2024-05-15 NOTE — ED Triage Notes (Signed)
 Pt arrives via EMS from [facility] for AMS. Pt caregiver came to visit her this morning and she wasn't as feisty as normal. Staff says she didn't respond to her wake up call and they found her in her office lethargic. Per EMS, pt oriented x3. Pt arrives alert and interactive with staff  CBG 110 Temp 98.66f

## 2024-05-17 LAB — URINE CULTURE: Culture: >=100000 — AB

## 2024-05-28 ENCOUNTER — Ambulatory Visit: Admitting: Urology
